# Patient Record
Sex: Female | Born: 1975 | Hispanic: Yes | Marital: Married | Smoking: Light tobacco smoker
Health system: Southern US, Community
[De-identification: ages and names within clinical notes are randomized; demographics above are authoritative.]

## PROBLEM LIST (undated history)

## (undated) DIAGNOSIS — T4145XA Adverse effect of unspecified anesthetic, initial encounter: Secondary | ICD-10-CM

## (undated) DIAGNOSIS — F32A Depression, unspecified: Secondary | ICD-10-CM

## (undated) DIAGNOSIS — G43909 Migraine, unspecified, not intractable, without status migrainosus: Secondary | ICD-10-CM

## (undated) DIAGNOSIS — F419 Anxiety disorder, unspecified: Secondary | ICD-10-CM

## (undated) DIAGNOSIS — J40 Bronchitis, not specified as acute or chronic: Secondary | ICD-10-CM

## (undated) DIAGNOSIS — F329 Major depressive disorder, single episode, unspecified: Secondary | ICD-10-CM

## (undated) DIAGNOSIS — I10 Essential (primary) hypertension: Secondary | ICD-10-CM

## (undated) DIAGNOSIS — T8859XA Other complications of anesthesia, initial encounter: Secondary | ICD-10-CM

---

## 2006-06-27 ENCOUNTER — Emergency Department (HOSPITAL_COMMUNITY): Admission: AD | Admit: 2006-06-27 | Discharge: 2006-06-27 | Payer: Self-pay | Admitting: Emergency Medicine

## 2007-12-06 ENCOUNTER — Emergency Department (HOSPITAL_COMMUNITY): Admission: EM | Admit: 2007-12-06 | Discharge: 2007-12-06 | Payer: Self-pay | Admitting: Emergency Medicine

## 2007-12-10 ENCOUNTER — Emergency Department (HOSPITAL_COMMUNITY): Admission: EM | Admit: 2007-12-10 | Discharge: 2007-12-10 | Payer: Self-pay | Admitting: Family Medicine

## 2007-12-21 ENCOUNTER — Encounter: Admission: RE | Admit: 2007-12-21 | Discharge: 2007-12-21 | Payer: Self-pay | Admitting: Obstetrics & Gynecology

## 2009-01-13 ENCOUNTER — Emergency Department (HOSPITAL_COMMUNITY): Admission: EM | Admit: 2009-01-13 | Discharge: 2009-01-13 | Payer: Self-pay | Admitting: Family Medicine

## 2010-04-13 ENCOUNTER — Emergency Department (HOSPITAL_COMMUNITY): Admission: EM | Admit: 2010-04-13 | Discharge: 2010-04-13 | Payer: Self-pay | Admitting: Emergency Medicine

## 2010-07-20 ENCOUNTER — Encounter
Admission: RE | Admit: 2010-07-20 | Discharge: 2010-07-20 | Payer: Self-pay | Source: Home / Self Care | Attending: Family Medicine | Admitting: Family Medicine

## 2010-10-03 LAB — POCT PREGNANCY, URINE: Preg Test, Ur: NEGATIVE

## 2011-03-24 LAB — POCT PREGNANCY, URINE
Operator id: 239701
Preg Test, Ur: NEGATIVE

## 2012-03-08 ENCOUNTER — Emergency Department (HOSPITAL_COMMUNITY)
Admission: EM | Admit: 2012-03-08 | Discharge: 2012-03-08 | Disposition: A | Discharge status: Home / Self Care | Payer: Self-pay | Attending: Emergency Medicine | Admitting: Emergency Medicine

## 2012-03-08 ENCOUNTER — Encounter (HOSPITAL_COMMUNITY): Payer: Self-pay | Admitting: Emergency Medicine

## 2012-03-08 DIAGNOSIS — T63481A Toxic effect of venom of other arthropod, accidental (unintentional), initial encounter: Secondary | ICD-10-CM

## 2012-03-08 DIAGNOSIS — T6391XA Toxic effect of contact with unspecified venomous animal, accidental (unintentional), initial encounter: Secondary | ICD-10-CM | POA: Insufficient documentation

## 2012-03-08 DIAGNOSIS — F3289 Other specified depressive episodes: Secondary | ICD-10-CM | POA: Insufficient documentation

## 2012-03-08 DIAGNOSIS — F329 Major depressive disorder, single episode, unspecified: Secondary | ICD-10-CM | POA: Insufficient documentation

## 2012-03-08 DIAGNOSIS — T63461A Toxic effect of venom of wasps, accidental (unintentional), initial encounter: Secondary | ICD-10-CM | POA: Insufficient documentation

## 2012-03-08 HISTORY — DX: Major depressive disorder, single episode, unspecified: F32.9

## 2012-03-08 HISTORY — DX: Depression, unspecified: F32.A

## 2012-03-08 MED ORDER — IBUPROFEN 400 MG PO TABS
600.0000 mg | ORAL_TABLET | Freq: Once | ORAL | Status: AC
  Administered 2012-03-08: 600 mg via ORAL
  Filled 2012-03-08: qty 1

## 2012-03-08 MED ORDER — DIPHENHYDRAMINE HCL 50 MG PO TABS: 25.0000 mg | ORAL_TABLET | Freq: Three times a day (TID) | ORAL | Status: DC | PRN

## 2012-03-08 MED ORDER — LIDOCAINE-PRILOCAINE 2.5-2.5 % EX CREA
1.0000 "application " | TOPICAL_CREAM | CUTANEOUS | Status: AC
  Administered 2012-03-08: 1 via TOPICAL
  Filled 2012-03-08: qty 5

## 2012-03-08 NOTE — ED Notes (Signed)
Was stung by yellow jackets itching and burning lungs clear given 50 benadryl iv has 20 in left ac no sob mnow still itching

## 2012-03-08 NOTE — ED Provider Notes (Signed)
History  This chart was scribed for Jones Skene, MD by Bennett Scrape. This patient was seen in room TR02C/TR02C and the patient's care was started at 4:03PM.  CSN: [id number]  Arrival date & time 03/08/12  1359   First MD Initiated Contact with Patient 03/08/12 1603      Chief Complaint  Patient presents with  . Allergic Reaction     The history is provided by the patient. A language interpreter was used Hospital doctor for spanish).    Kathy Berry is a 36 y.o. female who presents to the Emergency Department complaining of an allergic reaction after being stung by yellow jackets on her legs and arms that occurred about 2 hours ago. She reports that she started having itching and burning directly after the stings. She denies involvement of her lips or tongue. She denies taking OTC medications at home to improve symptoms PTA but was given 50 benadryl IV in the ED. She denies having trouble breathing, trouble swallowing, CP, abdominal pain, nausea, emesis and SOB as associated symptoms. She has a h/o depression. Pt denies smoking and alcohol use.  Past Medical History  Diagnosis Date  . Depression     History reviewed. No pertinent past surgical history.  History reviewed. No pertinent family history.  History  Substance Use Topics  . Smoking status: Never Smoker   . Smokeless tobacco: Not on file  . Alcohol Use: No    No OB history provided.  Review of Systems  REVIEW OF SYSTEMS:   1.) CONSTITUTIONAL: No fever, chills or systemic signs of infection. No recent, unexplained weight changes.   2.) HEENT: No facial pain, sinus congestion or rhinorrhea is reported. Patient is denying any acute visual or hearing deficits. No sore throat or difficulty swallowing.  3.) NECK: No swelling or masses are reported.   4.) PULMONARY: No cough sputum production or shortness of breath was reported.   5.) CARDIAC: No palpitations, chest pain or pressure.   6.)  ABDOMINAL: Denies abdominal pain, nausea, vomiting or diarrhea. No Hematochezia or melena.  7.) GENITOURINARY: No burning with urination or frequency. No discharge.  8.) BACK: Denying any flank or CVA tenderness. No specific thoracic or lumbar pain.   9.) EXTREMITIES: Denying any extremity edema pitting or rash.   10.) NEUROLOGIC: Denying any focal or lateralizing neurologic impairments.     11.) SKIN: Positive for multiple insect sings to back, chest and extremities. No rashes, itching   Allergies  Review of patient's allergies indicates no known allergies.  Home Medications   Current Outpatient Rx  Name Route Sig Dispense Refill  . IBUPROFEN 200 MG PO TABS Oral Take 200 mg by mouth every 6 (six) hours as needed. For pain      Triage Vitals: BP 125/86  Pulse 82  Temp 99.1 F (37.3 C) (Oral)  Resp 16  SpO2 95%  Physical Exam   PHYSICAL EXAM: VITAL SIGNS:  . Filed Vitals:   03/08/12 1413  BP: 125/86  Pulse: 82  Temp: 99.1 F (37.3 C)  TempSrc: Oral  Resp: 16  SpO2: 95%   CONSTITUTIONAL: Awake, oriented, appears non-toxic HENT: Atraumatic, normocephalic, oral mucosa pink and moist, airway patent. Nares patent without drainage. External ears normal. EYES: Conjunctiva clear, EOMI, PERRLA NECK: Trachea midline, non-tender, supple CARDIOVASCULAR: Normal heart rate, Normal rhythm, No murmurs, rubs, gallops PULMONARY/CHEST: Clear to auscultation, no rhonchi, wheezes, or rales. Symmetrical breath sounds. CHEST WALL: No lesions. Non-tender. ABDOMINAL: Non-distended, soft, non-tender - no rebound or  guarding.  BS normal. EXTREMITIES: No clubbing, cyanosis, or edema SKIN: Multiple stings with diamond sized surrounding welts lateral to the left scapula, mid upper back around T2-T3, each tricep, each thigh and sternal manubrial junction. Warm, Dry, No erythema, No rash   ED Course  Procedures (including critical care time)  DIAGNOSTIC STUDIES: Oxygen Saturation is 95%  on room air, adequate by my interpretation.    COORDINATION OF CARE: 4:34PM-Discussed discharge plan with pt at bedside and pt agreed to plan.   Labs Reviewed - No data to display No results found.   No diagnosis found.    MDM  Kathy Berry is a 36 y.o. female presenting s/p hymenopteran stings.  Doubt honeybee - no retained stingers.  Pt denies allergy to stings.  No systemic signs of anaphylaxis.  Local venom effects/symptoms persist.  Conservative treatment with benadryl po and EMLA cream for the next day or 2 for pain relief.  I explained the diagnosis and have given explicit precautions to return to the ER including difficulty breathing, hoarse voice or any other new or worsening symptoms. The patient understands and accepts the medical plan as it's been dictated and I have answered their questions. Discharge instructions concerning home care and prescriptions have been given.  The patient is STABLE and is discharged to home in good condition.   I personally performed the services described in this documentation, which was scribed in my presence. The recorded information has been reviewed and considered. Jones Skene, M.D.     Jones Skene, MD 03/12/12 1352

## 2012-03-08 NOTE — ED Notes (Addendum)
Pt reports she was stung by 7 or 8 yellow jackets, called 911, pt reports she became "hoarse" and warm to touch around bee stings. Pt reports symptoms have subsided. Pt speaking complete sentences, airway intact. 20 g IV to LAC. Pt given 2 pain pills from EMS, unsure of the name. Pt denies any allergies to bee stings

## 2012-04-23 ENCOUNTER — Inpatient Hospital Stay (HOSPITAL_COMMUNITY)
Admission: AD | Admit: 2012-04-23 | Discharge: 2012-04-23 | Disposition: A | Discharge status: Home / Self Care | Payer: Self-pay | Source: Ambulatory Visit | Attending: Obstetrics & Gynecology | Admitting: Obstetrics & Gynecology

## 2012-04-23 ENCOUNTER — Encounter (HOSPITAL_COMMUNITY): Payer: Self-pay | Admitting: *Deleted

## 2012-04-23 DIAGNOSIS — Z803 Family history of malignant neoplasm of breast: Secondary | ICD-10-CM | POA: Insufficient documentation

## 2012-04-23 DIAGNOSIS — N644 Mastodynia: Secondary | ICD-10-CM

## 2012-04-23 DIAGNOSIS — S29011A Strain of muscle and tendon of front wall of thorax, initial encounter: Secondary | ICD-10-CM

## 2012-04-23 LAB — POCT PREGNANCY, URINE: Preg Test, Ur: NEGATIVE

## 2012-04-23 MED ORDER — IBUPROFEN 600 MG PO TABS: 600.0000 mg | ORAL_TABLET | Freq: Four times a day (QID) | ORAL | Status: DC | PRN

## 2012-04-23 NOTE — Progress Notes (Signed)
[facility] Department of Clinical Social Work Documentation of Interpretation   I assisted _Dee RN__________________ with interpretation of ____questions__________________ for this patient.

## 2012-04-23 NOTE — Discharge Instructions (Signed)
Dolor msculoesqueltico (Musculoskeletal Pain) El dolor musculoesqueltico se siente en huesos y msculos. El dolor puede ocurrir en cualquier parte del cuerpo. El profesional que lo asiste podr tratarlo sin Geologist, engineering causa del dolor. Lo tratar [employer] de laboratorio (sangre y [place]), las radiografas y otros estudios sean normales. La causa de estos dolores puede ser un virus.  CAUSAS Generalmente no existe una causa definida para este trastorno. Tambin el [employer] puede deberse a la [place]. En la actividad excesiva se incluye el hacer ejercicios fsicos muy intensos cuando no se est en buena forma. El dolor de huesos tambin puede deberse a cambios climticos. Los huesos son sensibles a los cambios en la presin atmosfrica. INSTRUCCIONES PARA EL CUIDADO DOMICILIARIO  Para proteger su privacidad, no se entregarn los [employer] pruebas por telfono. Asegrese de conseguirlos. Consulte el modo en que podr obtenerlos si no se lo han informado. Es su responsabilidad contar con los [employer].  Utilice los medicamentos de venta libre o de prescripcin para Chief Technology Officer, Environmental health practitioner o la [place], segn se lo indique el profesional que lo asiste.Si le han administrado medicamentos, no conduzca, no opere maquinarias ni Diplomatic Services operational officer, y tampoco firme documentos legales durante 24 horas. No beba alcohol. No tome pldoras para dormir ni otros medicamentos que Museum/gallery curator.  Podr seguir con todas las actividades a menos que stas le ocasionen ms [employer]. Cuando el dolor disminuya, es importante que gradualmente reanude toda la rutina habitual. Retome las actividades comenzando lentamente. Aumente gradualmente la intensidad y la duracin de sus actividades o del ejercicio.  Durante los perodos de dolor intenso, el reposo en cama puede ser beneficioso. Recustese o sintese en la posicin que le sea ms cmoda.  Coloque hielo sobre  la zona afectada.  Ponga hielo en Lucile Shutters.  Colquese una toalla entre la piel y la bolsa de hielo.  Aplique el hielo durante 10 a 20 minutos 3  4 veces por da.  Si el dolor empeora, o no desaparece puede ser [employer] repetir las pruebas o Education officer, environmental nuevos exmenes. El profesional que lo asiste podr requerir investigar ms profundamente para Veterinary surgeon causa posible. SOLICITE ATENCIN MDICA DE INMEDIATO SI:  Siente que el dolor empeora y no se alivia con los medicamentos.  Siente dolor en el pecho asociado a falta de aire, sudoracin, nuseas o vmitos.  El dolor se localiza en el abdomen.  Comienza a sentir nuevos sntomas que parecen ser diferentes o que lo preocupan. ASEGRESE DE QUE:   Comprende las instrucciones para el alta mdica.  Controlar su enfermedad.  Solicitar atencin mdica de inmediato segn las indicaciones. Document Released: 03/23/2005 Document Revised: 09/05/2011 [facility] Patient Information 2013 [place], [place].

## 2012-04-23 NOTE — MAU Provider Note (Signed)
Chief Complaint: Breast Pain   None    SUBJECTIVE HPI: Kathy Berry is a 36 y.o. [id number] who presents to maternity admissions reporting pain in her right breast radiating to her right upper back.  The pt is crying in MAU and is worried about breast cancer.  She denies family history of breast cancer, but reports her grandmother died of stomach cancer.  She denies lifting anything heavy or doing any new activities this week.  She denies recent illness/coughing.  She did feel nauseous and vomit x1 earlier today.  She had a mammogram last year via the scholarship program but she is unsure of why she had the mammogram.  She denies vaginal bleeding, vaginal itching/burning, urinary symptoms, h/a, dizziness, n/v, or fever/chills.  Hospital Spanish translator used for all communication.    Past Medical History  Diagnosis Date  . Depression    Past Surgical History  Procedure Date  . Cesarean section    History   Social History  . Marital Status: Single    Spouse Name: N/A    Number of Children: N/A  . Years of Education: N/A   Occupational History  . Not on file.   Social History Main Topics  . Smoking status: Current Some Day Smoker    Types: Cigars  . Smokeless tobacco: Not on file  . Alcohol Use: No  . Drug Use: No  . Sexually Active:    Other Topics Concern  . Not on file   Social History Narrative  . No narrative on file   No current facility-administered medications on file prior to encounter.   Current Outpatient Prescriptions on File Prior to Encounter  Medication Sig Dispense Refill  . diphenhydrAMINE (BENADRYL) 50 MG tablet Take 0.5 tablets (25 mg total) by mouth every 8 (eight) hours as needed for itching or allergies.  30 tablet  0   No Known Allergies  ROS: Pertinent items in HPI  OBJECTIVE Blood pressure 137/86, pulse 91, temperature 98.5 F (36.9 C), temperature source Oral, resp. rate 16, height 5\' 2"  (1.575 m), weight 65.409 kg (144 lb 3.2 oz), last  menstrual period 03/21/2012, SpO2 100.00%. GENERAL: Well-developed, well-nourished female in no acute distress.  HEENT: Normocephalic HEART: normal rate/rhythm RESP: normal effort, lungs clear and equal bilaterally ABDOMEN: Soft, non-tender Negative CVA tenderness EXTREMITIES: Nontender, no edema NEURO: Alert and oriented SPECULUM EXAM: Deferred BREAST: Breasts appear normal, no suspicious masses, no skin or nipple changes or axillary nodes, significant tenderness to palpation at right sternal border, under right breast, and tenderness in right upper back to palpation  LAB RESULTS Results for orders placed during the hospital encounter of 04/23/12 (from the past 24 hour(s))  POCT PREGNANCY, URINE     Status: Normal   Collection Time   04/23/12  1:34 PM      Component Value Range   Preg Test, Ur NEGATIVE  NEGATIVE    IMAGING No results found.  ASSESSMENT 1. Breast pain, right   2. Muscle strain of chest wall     PLAN Discharge home Reassured pt of likely musculoskeletal pain because of tenderness to touch underneath breast and in her back.  Discussed low risks of breast cancer at age 56. Recommend she fill out scholarship information again for repeat breast imaging if problem persists Ibuprofen 600 mg Q6 hours x3-4 days Return to MAU if symptoms persist/worsen    Sharen Counter Certified Nurse-Midwife 04/23/2012  2:58 PM

## 2012-04-23 NOTE — MAU Provider Note (Signed)
Medical Screening exam and patient care preformed by advanced practice provider.  Agree with the above management.  

## 2012-04-23 NOTE — Progress Notes (Signed)
[facility] Department of Clinical Social Work Documentation of Interpretation   I assisted __Donna RN_________________ with interpretation of ____questions__________________ for this patient. 

## 2012-04-23 NOTE — MAU Note (Signed)
Patient states she went to [facility] today because she has been having pain in the right breast for 2 days. Was sent to MAU for evaluation.

## 2012-04-30 ENCOUNTER — Encounter (HOSPITAL_COMMUNITY): Payer: Self-pay

## 2012-04-30 ENCOUNTER — Ambulatory Visit (HOSPITAL_COMMUNITY)
Admission: RE | Admit: 2012-04-30 | Discharge: 2012-04-30 | Disposition: A | Discharge status: Home / Self Care | Payer: Self-pay | Source: Ambulatory Visit | Attending: Obstetrics and Gynecology | Admitting: Obstetrics and Gynecology

## 2012-04-30 ENCOUNTER — Other Ambulatory Visit: Payer: Self-pay | Admitting: Obstetrics and Gynecology

## 2012-04-30 VITALS — BP 106/70 | Temp 97.8°F | Ht 62.0 in | Wt 144.0 lb

## 2012-04-30 DIAGNOSIS — Z1239 Encounter for other screening for malignant neoplasm of breast: Secondary | ICD-10-CM

## 2012-04-30 DIAGNOSIS — N644 Mastodynia: Secondary | ICD-10-CM | POA: Insufficient documentation

## 2012-04-30 HISTORY — DX: Anxiety disorder, unspecified: F41.9

## 2012-04-30 HISTORY — DX: Essential (primary) hypertension: I10

## 2012-04-30 NOTE — Patient Instructions (Signed)
Taught patient how to perform BSE and gave educational materials to take home. Patient did not need a Pap smear today due to last Pap smear was in 2011 per patient. Let her know [facility] will cover Pap smears every 3 years unless has a history of abnormal Pap smears. Let her know her next Pap smear will be due next year. Patient referred to the [facility] for Diagnostic Mammogram and possible right breast ultrasound. Appointment scheduled for Friday, May 04, 2012 at 1330.    Patient aware of appointment and will be there. Patient verbalized understanding.

## 2012-04-30 NOTE — Progress Notes (Signed)
Complaints of right breast x 1 week that she rates at a 7 out of 10. Patient stated the pain in her breast has been happening on and off for past 3 years. Patient's last screening mammogram was 07/20/10 at the [facility] [place].  Pap Smear:    Pap smear not performed today. Per patient last Pap smear was in 2011 and normal. Per patient she has no history of abnormal Pap smears. No Pap smear results in EPIC.   Physical exam: Breasts Breasts symmetrical. No skin abnormalities bilateral breasts. No nipple retraction bilateral breasts. No nipple discharge bilateral breasts. No lymphadenopathy. No lumps palpated bilateral breasts. Patient complained of tenderness when palpated right breast. Tenderness was complained more towards the center of the breast. Patient referred to the [facility] for Diagnostic Mammogram and possible right breast ultrasound. Appointment scheduled for Friday, May 04, 2012 at 1330.        Pelvic/Bimanual No Pap smear completed today since per patient last Pap smear was in 2011 and normal. Pap smear not indicated per [facility] guidelines.

## 2012-04-30 NOTE — Assessment & Plan Note (Signed)
Patient referred to the [facility] for Diagnostic Mammogram and possible right breast ultrasound. Appointment scheduled for Friday, May 04, 2012 at 1330.

## 2012-05-04 ENCOUNTER — Ambulatory Visit
Admission: RE | Admit: 2012-05-04 | Discharge: 2012-05-04 | Disposition: A | Discharge status: Home / Self Care | Payer: No Typology Code available for payment source | Source: Ambulatory Visit | Attending: Obstetrics and Gynecology | Admitting: Obstetrics and Gynecology

## 2012-05-04 DIAGNOSIS — N644 Mastodynia: Secondary | ICD-10-CM

## 2012-06-28 NOTE — Addendum Note (Signed)
Encounter addended by: Saintclair Halsted, RN on: 06/28/2012  4:04 PM<BR>     Documentation filed: Charges VN

## 2013-02-06 ENCOUNTER — Emergency Department (HOSPITAL_COMMUNITY)
Admission: EM | Admit: 2013-02-06 | Discharge: 2013-02-06 | Disposition: A | Discharge status: Home / Self Care | Payer: Self-pay | Attending: Emergency Medicine | Admitting: Emergency Medicine

## 2013-02-06 ENCOUNTER — Encounter (HOSPITAL_COMMUNITY): Payer: Self-pay | Admitting: Nurse Practitioner

## 2013-02-06 ENCOUNTER — Emergency Department (HOSPITAL_COMMUNITY): Payer: Self-pay

## 2013-02-06 DIAGNOSIS — F17200 Nicotine dependence, unspecified, uncomplicated: Secondary | ICD-10-CM | POA: Insufficient documentation

## 2013-02-06 DIAGNOSIS — M79609 Pain in unspecified limb: Secondary | ICD-10-CM | POA: Insufficient documentation

## 2013-02-06 DIAGNOSIS — Z8709 Personal history of other diseases of the respiratory system: Secondary | ICD-10-CM | POA: Insufficient documentation

## 2013-02-06 DIAGNOSIS — Z8659 Personal history of other mental and behavioral disorders: Secondary | ICD-10-CM | POA: Insufficient documentation

## 2013-02-06 DIAGNOSIS — R079 Chest pain, unspecified: Secondary | ICD-10-CM

## 2013-02-06 DIAGNOSIS — I10 Essential (primary) hypertension: Secondary | ICD-10-CM | POA: Insufficient documentation

## 2013-02-06 DIAGNOSIS — R0789 Other chest pain: Secondary | ICD-10-CM | POA: Insufficient documentation

## 2013-02-06 HISTORY — DX: Bronchitis, not specified as acute or chronic: J40

## 2013-02-06 LAB — POCT I-STAT TROPONIN I: Troponin i, poc: 0 ng/mL (ref 0.00–0.08)

## 2013-02-06 LAB — POCT I-STAT, CHEM 8
Hemoglobin: 14.6 g/dL (ref 12.0–15.0)
Sodium: 141 mEq/L (ref 135–145)
TCO2: 22 mmol/L (ref 0–100)

## 2013-02-06 NOTE — ED Notes (Signed)
Per EMS pt c/o chest wall pain started in left shoulder and radiated to right side of chest. Tender to palpation, pain with deep palpation. 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 Denies n/v. sts pain takes her breathe. Pt sts minimal relief with ibuprofen. EMS GAVE 324 ASA. Pain 5/10- pressure. Pt[facility]. Pt sts not pregnant.

## 2013-02-06 NOTE — ED Provider Notes (Signed)
CSN: [id number]     Arrival date & time 02/06/13  1215 History     First MD Initiated Contact with Patient 02/06/13 1231     Chief Complaint  Patient presents with  . Chest Pain   (Consider location/radiation/quality/duration/timing/severity/associated sxs/prior Treatment) HPI Comments: Kathy Berry is a 37 y.o. Female who presents for evaluation of left arm and left chest pain. The pain started one week ago. It is gradually worsening. It is dull in nature. The pain comes and goes. The pain is not associated with movement of the neck or left. The pain is worse with deep breathing. 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 She is tearful, and states that she misses her children. Her children are in another country. She has not seen them in 11 years. She is not currently taking any medications regularly. She tried taking ibuprofen at home, without relief. She is transported here by EMS. During transport she received aspirin orally. There are no other known modifying factors.   Patient is a 37 y.o. female presenting with chest pain. The history is provided by the patient. A language interpreter was used Associate Professor).  Chest Pain   Past Medical History  Diagnosis Date  . Depression   . Hypertension   . Anxiety   . Bronchitis    Past Surgical History  Procedure Laterality Date  . Cesarean section     Family History  Problem Relation Age of Onset  . Other Neg Hx   . Hypertension Mother   . Cancer Maternal Grandmother     stomach  . Diabetes Maternal Grandfather    History  Substance Use Topics  . Smoking status: Current Every Day Smoker    Types: Cigars, Cigarettes  . Smokeless tobacco: Never Used  . Alcohol Use: No   OB History   Grav Para Term Preterm Abortions TAB SAB Ect Mult Living   2 2 2       2      Review of Systems  Cardiovascular: Positive for chest pain.  All other systems reviewed and are negative.    Allergies  Review of patient's  allergies indicates no known allergies.  Home Medications   Current Outpatient Rx  Name  Route  Sig  Dispense  Refill  . ibuprofen (ADVIL,MOTRIN) 600 MG tablet   Oral   Take 1 tablet (600 mg total) by mouth every 6 (six) hours as needed for pain.   30 tablet   0    BP 119/75  Pulse 93  Temp(Src) 98.1 F (36.7 C) (Oral)  Resp 18  Ht 5\' 2"  (1.575 m)  Wt 140 lb (63.504 kg)  BMI 25.6 kg/m2  SpO2 100% Physical Exam  Nursing note and vitals reviewed. Constitutional: She is oriented to person, place, and time. She appears well-developed and well-nourished.  HENT:  Head: Normocephalic and atraumatic.  Eyes: Conjunctivae and EOM are normal. Pupils are equal, round, and reactive to light.  Neck: Normal range of motion and phonation normal. Neck supple.  Cardiovascular: Normal rate, regular rhythm and intact distal pulses.   Pulmonary/Chest: Effort normal and breath sounds normal. She exhibits tenderness (mild left upper chest wall tenderness. No associated crepitation or deformity).  Abdominal: Soft. She exhibits no distension. There is no tenderness. There is no guarding.  Musculoskeletal: Normal range of motion.  Neurological: She is alert and oriented to person, place, and time. She has normal strength. She exhibits normal muscle tone.  Skin: Skin is warm and  dry.  Psychiatric: Her behavior is normal. Judgment and thought content normal.  Tearful when talking about missing her children    ED Course   Procedures (including critical care time)  On arrival to the ED, she PERC negative      Date: 02/06/13  Rate: 87  Rhythm: normal sinus rhythm  QRS Axis: normal  PR and QT Intervals: normal  ST/T Wave abnormalities: normal  PR and QRS Conduction Disutrbances:none  Narrative Interpretation:   Old EKG Reviewed: none available    Labs Reviewed  POCT I-STAT, CHEM 8  POCT I-STAT TROPONIN I    1. Nonspecific chest pain     MDM  Nonspecific CP. Doubt ACS, PE, PNE.  Doubt metabolic instability, serious bacterial infection or impending vascular collapse; the patient is stable for discharge.   Nursing Notes Reviewed/ Care Coordinated Applicable Imaging Reviewed Interpretation of Laboratory Data incorporated into ED treatment  Plan: Home Medications- none; Home Treatments- Rest, watch for progressive sx; return here if the recommended treatment, does not improve the symptoms; Recommended follow up- PCP prn   Flint Melter, MD 02/08/13 1714

## 2013-04-21 ENCOUNTER — Emergency Department (HOSPITAL_COMMUNITY): Payer: No Typology Code available for payment source

## 2013-04-21 ENCOUNTER — Encounter (HOSPITAL_COMMUNITY): Payer: Self-pay | Admitting: Emergency Medicine

## 2013-04-21 ENCOUNTER — Emergency Department (HOSPITAL_COMMUNITY)
Admission: EM | Admit: 2013-04-21 | Discharge: 2013-04-22 | Disposition: A | Discharge status: Home / Self Care | Payer: No Typology Code available for payment source | Attending: Emergency Medicine | Admitting: Emergency Medicine

## 2013-04-21 DIAGNOSIS — R112 Nausea with vomiting, unspecified: Secondary | ICD-10-CM | POA: Insufficient documentation

## 2013-04-21 DIAGNOSIS — G8929 Other chronic pain: Secondary | ICD-10-CM | POA: Insufficient documentation

## 2013-04-21 DIAGNOSIS — Z87891 Personal history of nicotine dependence: Secondary | ICD-10-CM | POA: Insufficient documentation

## 2013-04-21 DIAGNOSIS — Z3202 Encounter for pregnancy test, result negative: Secondary | ICD-10-CM | POA: Insufficient documentation

## 2013-04-21 DIAGNOSIS — I10 Essential (primary) hypertension: Secondary | ICD-10-CM | POA: Insufficient documentation

## 2013-04-21 DIAGNOSIS — Z8659 Personal history of other mental and behavioral disorders: Secondary | ICD-10-CM | POA: Insufficient documentation

## 2013-04-21 DIAGNOSIS — R071 Chest pain on breathing: Secondary | ICD-10-CM | POA: Insufficient documentation

## 2013-04-21 DIAGNOSIS — Z8709 Personal history of other diseases of the respiratory system: Secondary | ICD-10-CM | POA: Insufficient documentation

## 2013-04-21 DIAGNOSIS — R0602 Shortness of breath: Secondary | ICD-10-CM | POA: Insufficient documentation

## 2013-04-21 DIAGNOSIS — R0789 Other chest pain: Secondary | ICD-10-CM

## 2013-04-21 HISTORY — DX: Migraine, unspecified, not intractable, without status migrainosus: G43.909

## 2013-04-21 LAB — CBC
HCT: 40.3 % (ref 36.0–46.0)
Hemoglobin: 13.7 g/dL (ref 12.0–15.0)
MCH: 30.7 pg (ref 26.0–34.0)
MCHC: 34 g/dL (ref 30.0–36.0)
MCV: 90.4 fL (ref 78.0–100.0)

## 2013-04-21 LAB — POCT I-STAT TROPONIN I: Troponin i, poc: 0.01 ng/mL (ref 0.00–0.08)

## 2013-04-21 NOTE — ED Notes (Signed)
C/o pressure in center of chest with sob, nausea, and vomited x 4.  States symptoms started 1 hour ago while watching TV.

## 2013-04-22 ENCOUNTER — Encounter (HOSPITAL_COMMUNITY): Payer: Self-pay | Admitting: Emergency Medicine

## 2013-04-22 LAB — URINALYSIS, ROUTINE W REFLEX MICROSCOPIC
Glucose, UA: NEGATIVE mg/dL
Hgb urine dipstick: NEGATIVE
Specific Gravity, Urine: 1.011 (ref 1.005–1.030)
Urobilinogen, UA: 0.2 mg/dL (ref 0.0–1.0)

## 2013-04-22 LAB — HEPATIC FUNCTION PANEL
ALT: 12 U/L (ref 0–35)
AST: 17 U/L (ref 0–37)
Albumin: 4 g/dL (ref 3.5–5.2)
Total Protein: 7.6 g/dL (ref 6.0–8.3)

## 2013-04-22 LAB — BASIC METABOLIC PANEL
BUN: 17 mg/dL (ref 6–23)
Calcium: 9.3 mg/dL (ref 8.4–10.5)
Chloride: 103 mEq/L (ref 96–112)
GFR calc non Af Amer: >90 mL/min (ref >90)
Glucose, Bld: 141 mg/dL — ABNORMAL HIGH (ref 70–99)

## 2013-04-22 LAB — LIPASE, BLOOD: Lipase: 27 U/L (ref 11–59)

## 2013-04-22 MED ORDER — CYCLOBENZAPRINE HCL 10 MG PO TABS: 10.0000 mg | ORAL_TABLET | Freq: Three times a day (TID) | ORAL | Status: DC | PRN

## 2013-04-22 MED ORDER — NAPROXEN 250 MG PO TABS: 250.0000 mg | ORAL_TABLET | Freq: Two times a day (BID) | ORAL | Status: DC

## 2013-04-22 MED ORDER — IBUPROFEN 200 MG PO TABS
400.0000 mg | ORAL_TABLET | Freq: Once | ORAL | Status: AC
  Administered 2013-04-22: 400 mg via ORAL
  Filled 2013-04-22: qty 2

## 2013-04-22 MED ORDER — OXYCODONE-ACETAMINOPHEN 5-325 MG PO TABS
1.0000 | ORAL_TABLET | Freq: Once | ORAL | Status: AC
  Administered 2013-04-22: 1 via ORAL
  Filled 2013-04-22: qty 1

## 2013-04-22 MED ORDER — HYDROCODONE-ACETAMINOPHEN 5-325 MG PO TABS: ORAL_TABLET | ORAL | Status: DC

## 2013-04-22 NOTE — ED Provider Notes (Signed)
CSN: [id number]     Arrival date & time 04/21/13  2233 History   First MD Initiated Contact with Patient 04/21/13 2351     Chief Complaint  Patient presents with  . Shortness of Breath  . Chest Pain    HPI Pt was seen at 0005. Per pt and her husband, c/o gradual onset and persistence of constant acute flair of her chronic left sided chest pain for the past several years, worse over the past 2 months. Pt states she has the pain "every day, all day" for the past 2 months. Has been evaluated by the ED and her PMD for same, and told it "was stress" and "a muscle spasm." Pt describes the chest pain as "sharp" "pressure" in the left lateral side of her chest and left thoracic back area.. Pain worsens with palpation of the area. Pt states the pain makes her SOB and nauseated. States she vomited PTA. Denies diarrhea, no cough, no injury, no abd pain, no black or blood in stools or emesis, no rash, no fevers.     Past Medical History  Diagnosis Date  . Depression   . Hypertension   . Anxiety   . Bronchitis   . Migraine headache    Past Surgical History  Procedure Laterality Date  . Cesarean section     Family History  Problem Relation Age of Onset  . Other Neg Hx   . Hypertension Mother   . Cancer Maternal Grandmother     stomach  . Diabetes Maternal Grandfather    History  Substance Use Topics  . Smoking status: Former Smoker    Types: Cigarettes, Cigars    Quit date: 04/21/2012  . Smokeless tobacco: Never Used  . Alcohol Use: No   OB History   Grav Para Term Preterm Abortions TAB SAB Ect Mult Living   2 2 2       2      Review of Systems ROS: Statement: All systems negative except as marked or noted in the HPI; Constitutional: Negative for fever and chills. ; ; Eyes: Negative for eye pain, redness and discharge. ; ; ENMT: Negative for ear pain, hoarseness, nasal congestion, sinus pressure and sore throat. ; ; Cardiovascular: +CP, SOB. Negative for palpitations, diaphoresis, and  peripheral edema. ; ; Respiratory: Negative for cough, wheezing and stridor. ; ; Gastrointestinal: +N/V. Negative for diarrhea, abdominal pain, blood in stool, hematemesis, jaundice and rectal bleeding. . ; ; Genitourinary: Negative for dysuria, flank pain and hematuria. ; ; Musculoskeletal: Negative for back pain and neck pain. Negative for swelling and trauma.; ; Skin: Negative for pruritus, rash, abrasions, blisters, bruising and skin lesion.; ; Neuro: Negative for headache, lightheadedness and neck stiffness. Negative for weakness, altered level of consciousness , altered mental status, extremity weakness, paresthesias, involuntary movement, seizure and syncope.       Allergies  Review of patient's allergies indicates no known allergies.  Home Medications   Current Outpatient Rx  Name  Route  Sig  Dispense  Refill  . ibuprofen (ADVIL,MOTRIN) 600 MG tablet   Oral   Take 1 tablet (600 mg total) by mouth every 6 (six) hours as needed for pain.   30 tablet   0    BP 141/82  Pulse 109  Temp(Src) 98.3 F (36.8 C) (Oral)  Resp 19  SpO2 100%  LMP 04/02/2013 Physical Exam 0010: Physical examination:  Nursing notes reviewed; Vital signs and O2 SAT reviewed;  Constitutional: Well developed, Well nourished, Well hydrated,  In no acute distress; Head:  Normocephalic, atraumatic; Eyes: EOMI, PERRL, No scleral icterus; ENMT: Mouth and pharynx normal, Mucous membranes moist; Neck: Supple, Full range of motion, No lymphadenopathy; Cardiovascular: Tachycardic and rhythm, No murmur, rub, or gallop; Respiratory: Breath sounds clear & equal bilaterally, No rales, rhonchi, wheezes.  Speaking full sentences with ease, Normal respiratory effort/excursion; Chest: +left lateral chest wall tender to palp. No rash, no soft tissue crepitus, no deformity. Movement normal; Abdomen: Soft, Nontender, Nondistended, Normal bowel sounds; Genitourinary: No CVA tenderness; Extremities: Pulses normal, No tenderness, No  edema, No calf edema or asymmetry.; Neuro: AA&Ox3, Major CN grossly intact.  Speech clear. No gross focal motor or sensory deficits in extremities.; Skin: Color normal, Warm, Dry.   ED Course  Procedures    EKG Interpretation     Ventricular Rate:  122 PR Interval:  174 QRS Duration: 90 QT Interval:  306 QTC Calculation: 436 R Axis:   50 Text Interpretation:  Sinus tachycardia RSR' or QR pattern in V1 suggests right ventricular conduction delay No significant change since last tracing            MDM  MDM Reviewed: previous chart, nursing note and vitals Reviewed previous: labs and ECG Interpretation: labs, ECG and x-ray   Results for orders placed during the hospital encounter of 04/21/13  CBC      Result Value Range   WBC 8.8  4.0 - 10.5 K/uL   RBC 4.46  3.87 - 5.11 MIL/uL   Hemoglobin 13.7  12.0 - 15.0 g/dL   HCT 16.1  09.6 - 04.5 %   MCV 90.4  78.0 - 100.0 fL   MCH 30.7  26.0 - 34.0 pg   MCHC 34.0  30.0 - 36.0 g/dL   RDW 40.9  81.1 - 91.4 %   Platelets 212  150 - 400 K/uL  BASIC METABOLIC PANEL      Result Value Range   Sodium 138  135 - 145 mEq/L   Potassium 3.7  3.5 - 5.1 mEq/L   Chloride 103  96 - 112 mEq/L   CO2 22  19 - 32 mEq/L   Glucose, Bld 141 (*) 70 - 99 mg/dL   BUN 17  6 - 23 mg/dL   Creatinine, Ser 7.82  0.50 - 1.10 mg/dL   Calcium 9.3  8.4 - 95.6 mg/dL   GFR calc non Af Amer >90  >90 mL/min   GFR calc Af Amer >90  >90 mL/min  D-DIMER, QUANTITATIVE      Result Value Range   D-Dimer, Quant <0.27  0.00 - 0.48 ug/mL-FEU  PREGNANCY, URINE      Result Value Range   Preg Test, Ur NEGATIVE  NEGATIVE  URINALYSIS, ROUTINE W REFLEX MICROSCOPIC      Result Value Range   Color, Urine YELLOW  YELLOW   APPearance CLEAR  CLEAR   Specific Gravity, Urine 1.011  1.005 - 1.030   pH 6.0  5.0 - 8.0   Glucose, UA NEGATIVE  NEGATIVE mg/dL   Hgb urine dipstick NEGATIVE  NEGATIVE   Bilirubin Urine NEGATIVE  NEGATIVE   Ketones, ur NEGATIVE  NEGATIVE mg/dL    Protein, ur NEGATIVE  NEGATIVE mg/dL   Urobilinogen, UA 0.2  0.0 - 1.0 mg/dL   Nitrite NEGATIVE  NEGATIVE   Leukocytes, UA NEGATIVE  NEGATIVE  LIPASE, BLOOD      Result Value Range   Lipase 27  11 - 59 U/L  HEPATIC FUNCTION PANEL  Result Value Range   Total Protein 7.6  6.0 - 8.3 g/dL   Albumin 4.0  3.5 - 5.2 g/dL   AST 17  0 - 37 U/L   ALT 12  0 - 35 U/L   Alkaline Phosphatase 60  39 - 117 U/L   Total Bilirubin 0.2 (*) 0.3 - 1.2 mg/dL   Bilirubin, Direct <4.0  0.0 - 0.3 mg/dL   Indirect Bilirubin NOT CALCULATED  0.3 - 0.9 mg/dL  POCT I-STAT TROPONIN I      Result Value Range   Troponin i, poc 0.01  0.00 - 0.08 ng/mL   Comment 3            Dg Chest 2 View 04/21/2013   CLINICAL DATA:  Shortness of breath and chest  EXAM: CHEST  2 VIEW  COMPARISON:  02/06/2013  FINDINGS: The heart size and mediastinal contours are within normal limits. Both lungs are clear. The visualized skeletal structures are unremarkable.  IMPRESSION: No active cardiopulmonary disease.   Electronically Signed   By: Tiburcio Pea M.D.   On: 04/21/2013 23:11    0345: Doubt PE as cause for symptoms with normal d-dimer and low risk [place].  Doubt ACS as cause for symptoms with normal troponin and unchanged EKG from previous after 2 months of constant symptoms. Will tx symptomatically at this time. Pt wants to go home now. Dx and testing d/w pt and family.  Questions answered.  Verb understanding, agreeable to d/c home with outpt f/u.        Laray Anger, DO 04/25/13 2001

## 2013-04-22 NOTE — ED Notes (Signed)
Patient stating that she is "short of breath." O2 remained at 100% on RA. HR 102. Lungs clear in all fields. Dr. Clarene Duke made aware.

## 2013-07-03 ENCOUNTER — Emergency Department (HOSPITAL_COMMUNITY): Payer: Self-pay

## 2013-07-03 ENCOUNTER — Emergency Department (HOSPITAL_COMMUNITY)
Admission: EM | Admit: 2013-07-03 | Discharge: 2013-07-03 | Disposition: A | Discharge status: Home / Self Care | Payer: Self-pay | Attending: Emergency Medicine | Admitting: Emergency Medicine

## 2013-07-03 ENCOUNTER — Encounter (HOSPITAL_COMMUNITY): Payer: Self-pay | Admitting: Emergency Medicine

## 2013-07-03 ENCOUNTER — Other Ambulatory Visit: Payer: Self-pay

## 2013-07-03 DIAGNOSIS — IMO0002 Reserved for concepts with insufficient information to code with codable children: Secondary | ICD-10-CM | POA: Insufficient documentation

## 2013-07-03 DIAGNOSIS — R110 Nausea: Secondary | ICD-10-CM | POA: Insufficient documentation

## 2013-07-03 DIAGNOSIS — Z87891 Personal history of nicotine dependence: Secondary | ICD-10-CM | POA: Insufficient documentation

## 2013-07-03 DIAGNOSIS — Z8659 Personal history of other mental and behavioral disorders: Secondary | ICD-10-CM | POA: Insufficient documentation

## 2013-07-03 DIAGNOSIS — Z3202 Encounter for pregnancy test, result negative: Secondary | ICD-10-CM | POA: Insufficient documentation

## 2013-07-03 DIAGNOSIS — M940 Chondrocostal junction syndrome [Tietze]: Secondary | ICD-10-CM | POA: Insufficient documentation

## 2013-07-03 DIAGNOSIS — G43909 Migraine, unspecified, not intractable, without status migrainosus: Secondary | ICD-10-CM | POA: Insufficient documentation

## 2013-07-03 DIAGNOSIS — Z8709 Personal history of other diseases of the respiratory system: Secondary | ICD-10-CM | POA: Insufficient documentation

## 2013-07-03 DIAGNOSIS — I10 Essential (primary) hypertension: Secondary | ICD-10-CM | POA: Insufficient documentation

## 2013-07-03 DIAGNOSIS — M62838 Other muscle spasm: Secondary | ICD-10-CM

## 2013-07-03 DIAGNOSIS — Z79899 Other long term (current) drug therapy: Secondary | ICD-10-CM | POA: Insufficient documentation

## 2013-07-03 DIAGNOSIS — M5380 Other specified dorsopathies, site unspecified: Secondary | ICD-10-CM | POA: Insufficient documentation

## 2013-07-03 LAB — BASIC METABOLIC PANEL
BUN: 13 mg/dL (ref 6–23)
CHLORIDE: 106 meq/L (ref 96–112)
CO2: 27 mEq/L (ref 19–32)
CREATININE: 0.65 mg/dL (ref 0.50–1.10)
Calcium: 9.4 mg/dL (ref 8.4–10.5)
GFR calc non Af Amer: >90 mL/min (ref >90)
Glucose, Bld: 100 mg/dL — ABNORMAL HIGH (ref 70–99)
Potassium: 4.4 mEq/L (ref 3.7–5.3)
SODIUM: 144 meq/L (ref 137–147)

## 2013-07-03 LAB — CBC
HCT: 41.8 % (ref 36.0–46.0)
Hemoglobin: 14 g/dL (ref 12.0–15.0)
MCH: 31 pg (ref 26.0–34.0)
MCHC: 33.5 g/dL (ref 30.0–36.0)
MCV: 92.7 fL (ref 78.0–100.0)
PLATELETS: 225 10*3/uL (ref 150–400)
RBC: 4.51 MIL/uL (ref 3.87–5.11)
RDW: 13.1 % (ref 11.5–15.5)
WBC: 5.4 10*3/uL (ref 4.0–10.5)

## 2013-07-03 LAB — PRO B NATRIURETIC PEPTIDE: PRO B NATRI PEPTIDE: 25.3 pg/mL (ref 0–125)

## 2013-07-03 LAB — POCT I-STAT TROPONIN I: Troponin i, poc: 0.01 ng/mL (ref 0.00–0.08)

## 2013-07-03 LAB — POCT PREGNANCY, URINE: Preg Test, Ur: NEGATIVE

## 2013-07-03 MED ORDER — RANITIDINE HCL 300 MG PO TABS: 300.0000 mg | ORAL_TABLET | Freq: Every day | ORAL | Status: DC

## 2013-07-03 MED ORDER — PREDNISONE 50 MG PO TABS
50.0000 mg | ORAL_TABLET | Freq: Every day | ORAL | Status: DC
Start: 2013-07-03 — End: 2013-07-03

## 2013-07-03 MED ORDER — METHOCARBAMOL 500 MG PO TABS: 500.0000 mg | ORAL_TABLET | Freq: Two times a day (BID) | ORAL | Status: DC

## 2013-07-03 MED ORDER — RANITIDINE HCL 150 MG PO CAPS: 150.0000 mg | ORAL_CAPSULE | Freq: Every day | ORAL | Status: DC

## 2013-07-03 MED ORDER — PREDNISONE 50 MG PO TABS: 50.0000 mg | ORAL_TABLET | Freq: Every day | ORAL | Status: DC

## 2013-07-03 MED ORDER — IBUPROFEN 400 MG PO TABS: 400.0000 mg | ORAL_TABLET | Freq: Four times a day (QID) | ORAL | Status: DC | PRN

## 2013-07-03 MED ORDER — IBUPROFEN 800 MG PO TABS: 800.0000 mg | ORAL_TABLET | Freq: Three times a day (TID) | ORAL | Status: DC | PRN

## 2013-07-03 NOTE — ED Notes (Signed)
MD at bedside. 

## 2013-07-03 NOTE — ED Notes (Signed)
The pt returned from xray 

## 2013-07-03 NOTE — ED Notes (Signed)
To x-ray

## 2013-07-03 NOTE — ED Provider Notes (Addendum)
CSN: [id number]     Arrival date & time 07/03/13  9147 History   First MD Initiated Contact with Patient 07/03/13 1057     Chief Complaint  Patient presents with  . Shortness of Breath  . Back Pain   (Consider location/radiation/quality/duration/timing/severity/associated sxs/prior Treatment) HPI Comments: Pt comes in with cc of chest pain, nausea, back pain, dib. Sx started at 1 am - but chest pain has been ongoing for months now. Pain is located in the scapular region, between her blades. There is no anterior chest pain. Pt was having some dib with laying flat, and also her pain is worse when laying flat. She has no n/v/f/c - no cough. No hx of PE, DVT, and no risk factors for the same. No night sweats. Pt had a dimer ordered for her chest pain just her last visit - few months back, and that was normal as well. Pt states that she has some neck pain on the left side as well. Her pain is worse with palpation and with movement of the arm. Taking naproxen and heat pads at home.  Patient is a 38 y.o. female presenting with shortness of breath and back pain. The history is provided by the patient.  Shortness of Breath Associated symptoms: neck pain   Associated symptoms: no abdominal pain, no chest pain, no headaches and no vomiting   Back Pain Associated symptoms: no abdominal pain, no chest pain, no dysuria and no headaches     Past Medical History  Diagnosis Date  . Depression   . Hypertension   . Anxiety   . Bronchitis   . Migraine headache    Past Surgical History  Procedure Laterality Date  . Cesarean section     Family History  Problem Relation Age of Onset  . Other Neg Hx   . Hypertension Mother   . Cancer Maternal Grandmother     stomach  . Diabetes Maternal Grandfather    History  Substance Use Topics  . Smoking status: Former Smoker    Types: Cigarettes, Cigars    Quit date: 04/21/2012  . Smokeless tobacco: Never Used  . Alcohol Use: No   OB History   Grav Para  Term Preterm Abortions TAB SAB Ect Mult Living   2 2 2       2      Review of Systems  Constitutional: Negative for activity change.  Respiratory: Negative for shortness of breath.   Cardiovascular: Negative for chest pain.  Gastrointestinal: Negative for nausea, vomiting and abdominal pain.  Genitourinary: Negative for dysuria.  Musculoskeletal: Positive for back pain and neck pain.  Neurological: Negative for headaches.    Allergies  Review of patient's allergies indicates no known allergies.  Home Medications   Current Outpatient Rx  Name  Route  Sig  Dispense  Refill  . ibuprofen (ADVIL,MOTRIN) 800 MG tablet   Oral   Take 1 tablet (800 mg total) by mouth every 8 (eight) hours as needed.   21 tablet   0   . methocarbamol (ROBAXIN) 500 MG tablet   Oral   Take 1 tablet (500 mg total) by mouth 2 (two) times daily.   20 tablet   0   . predniSONE (DELTASONE) 50 MG tablet   Oral   Take 1 tablet (50 mg total) by mouth daily.   5 tablet   0   . ranitidine (ZANTAC) 150 MG capsule   Oral   Take 1 capsule (150 mg total) by mouth  daily.   30 capsule   0    BP 121/72  Pulse 91  Temp(Src) 99.4 F (37.4 C) (Oral)  Resp 18  SpO2 100%  LMP 06/27/2013 Physical Exam  Nursing note and vitals reviewed. Constitutional: She is oriented to person, place, and time. She appears well-developed and well-nourished.  HENT:  Head: Normocephalic and atraumatic.  Eyes: EOM are normal. Pupils are equal, round, and reactive to light.  Neck: Neck supple.  Cardiovascular: Normal rate, regular rhythm, normal heart sounds and intact distal pulses.   No murmur heard. Pulmonary/Chest: Effort normal. No respiratory distress.  Abdominal: Soft. She exhibits no distension. There is no tenderness. There is no rebound and no guarding.  Musculoskeletal:  Reproducible pain  - in the scapular, region and intrascapular region with palpation.  Neurological: She is alert and oriented to person, place,  and time.  Skin: Skin is warm and dry.    ED Course  Procedures (including critical care time) Labs Review Labs Reviewed  BASIC METABOLIC PANEL - Abnormal; Notable for the following:    Glucose, Bld 100 (*)    All other components within normal limits  CBC  PRO B NATRIURETIC PEPTIDE  POCT PREGNANCY, URINE  POCT I-STAT TROPONIN I   Imaging Review Dg Chest 2 View  07/03/2013   CLINICAL DATA:  Shortness of breath.  Back pain.  EXAM: CHEST  2 VIEW  COMPARISON:  PA and lateral chest 04/21/2013.  FINDINGS: Heart size and mediastinal contours are within normal limits. Both lungs are clear. Visualized skeletal structures are unremarkable.  IMPRESSION: Negative exam.   Electronically Signed   By: Drusilla Kannerhomas  Dalessio M.D.   On: 07/03/2013 09:44   Dg Lumbar Spine Complete  07/03/2013   CLINICAL DATA:  Back pain  EXAM: LUMBAR SPINE - COMPLETE 4+ VIEW  COMPARISON:  None.  FINDINGS: Transitional anatomy with partial sacralization of the L5 vertebral body. No evidence of acute fracture or malalignment. Vertebral body heights are maintained. Mild disc height loss at L5-S1. The T12 ribs are hypoplastic.  IMPRESSION: No acute fracture or malalignment.  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   Electronically Signed   By: Malachy MoanHeath  McCullough M.D.   On: 07/03/2013 15:51    EKG Interpretation   None       MDM   1. Muscle spasm   2. Costochondritis, acute    Pt comes in with some back pain, upper back - may be pleuritic, definitely worse with palpation. Pt had a dimer ordered, and other cardiac workup last time - all neg. She has been advised to see her pcp for further eval - as i think this is either m-s pain, or other non emergent etiology. She has no dysphagia - and so dont think it's esophageal. No concerns for dissection - as she has no risk factos, and neurovascular exam is normal.    Derwood KaplanAnkit Aleathia Purdy, MD 07/03/13 1631   Date: 07/03/2013   Rate: 101  Rhythm: normal sinus rhythm  QRS Axis: normal  Intervals: normal  ST/T Wave abnormalities: normal  Conduction Disutrbances: none  Narrative Interpretation: unremarkable      Derwood KaplanAnkit Eleyna Brugh, MD 07/03/13 [phone]

## 2013-07-03 NOTE — ED Notes (Signed)
Family at bedside. 

## 2013-07-03 NOTE — Discharge Instructions (Signed)
We saw you in the ER for the chest pain, back pain. All the results in the ER are normal, labs and imaging. We are not sure what is causing your symptoms - but it appears to be due to muscular pain. The workup in the ER is not complete, and is limited to screening for life threatening and emergent conditions only, so please see a primary care doctor for further evaluation.   Dolor de la pared torcica (Chest Wall Pain) Dolor en la pared torcica es dolor en o alrededor de los huesos y msculos de su pecho. Podrn pasar hasta 6 semanas hasta que comience a mejorar. Puede demorar ms tiempo si es fsicamente activo en su [place].  CAUSAS  El dolor en el pecho puede aparecer sin motivo. No obstante, algunas causas pueden ser:   Neomia Dear enfermedad viral como la gripe.  Traumatismos.  Tos.  La prctica de ejercicios.  Artritis.  Fibromialgia  [place]. INSTRUCCIONES PARA EL CUIDADO DOMICILIARIO  Evite hacer actividad fsica extenuante. Trate de no esforzarse o Electrical engineer. Aqu se incluyen las actividades en las que [place] los msculos del trax, los abdominales y los msculos laterales, especialmente si debe levantar objetos pesados.  Aplique hielo sobre la zona dolorida.  Ponga el hielo en una bolsa plstica.  Colquese una toalla entre la piel y la bolsa de hielo.  Deje la bolsa de hielo durante 15 a 20 minutos por hora, durante los primeros 2 [address]  [address].  Utilice los medicamentos de venta libre o de prescripcin para Chief Technology Officer, Environmental health practitioner o la [place], segn se lo indique el profesional que lo asiste. SOLICITE ATENCIN MDICA DE INMEDIATO SI:  El dolor aumenta o siente muchas molestias.  Tiene fiebre.  El dolor de [place].  Desarrolla nuevos e inexplicables sntomas.  Tiene nuseas o vmitos.  Berenice Primas o se siente mareado.  Tiene tos con flema (esputo), o tose con sangre. EST SEGURO QUE:   Comprende las instrucciones para el  alta mdica.  Controlar su enfermedad.  Solicitar atencin mdica de inmediato segn las indicaciones. Document Released: 07/25/2006 Document Revised: 09/05/2011 [facility] Patient Information 2014 [place], [place].  Costocondritis (Costochondritis) El profesional que lo asiste le ha diagnosticado el sndrome de la unin costocondral. La costocondritis (sndrome de Orthoptist) es la hinchazn e irritacin (inflamacin) del tejido (cartlago) que conecta las costillas con el esternn. Puede ocurrir espontneamente (sin otra causa), por un traumatismo (lesin causada por un accidente) o simplemente al toser, o luego de un ejercicio de poca intensidad. Podr tardar Elaina Hoops 6 semanas en curarse, y ms si no puede restringir sus actividades.  INSTRUCCIONES PARA EL CUIDADO DOMICILIARIO  Evite la actividad fsica extenuante. Trate de no esforzar las [employer] actividades habituales. Aqu se incluyen las [address] en las que [place] los msculos del pecho, abdominales (el vientre) y los Financial planner, especialmente si debe Artist.  Aplique hielo durante 15 a 20 minutos por hora mientras se encuentre despierto, durante los 2 primeros das. Coloque el hielo en una bolsa plstica y ponga una toalla entre la bolsa y la piel.  Utilice los medicamentos de venta libre o de prescripcin para Chief Technology Officer, Environmental health practitioner o la [place], segn se lo indique el profesional que lo asiste. SOLICITE ATENCIN MDICA DE INMEDIATO SI:  El dolor aumenta o siente muchas molestias.  Tiene fiebre.  Presenta dificultades respiratorias.  Tose y escupe sangre.  Siente falta de aire o dolor en el pecho, sudoracin o vmitos.  Desarrolla nuevos e inexplicables sntomas (problemas). EST SEGURO QUE:   Comprende las instrucciones para el alta mdica.  Controlar su enfermedad.  Solicitar atencin mdica de inmediato segn las indicaciones. Document Released: 03/23/2005 Document Revised: 09/05/2011 [facility]  Patient Information 2014 [place], [place].  Terapia con calor  (Heat Therapy)  La terapia con calor puede ayudar a aliviar los msculos y las articulaciones doloridos, tensos y rgidos. El calor no debe aplicarse en lesiones nuevas. Espere por lo menos a que pasen 48 horas despus de una lesin antes de emplear la terapia con calor. El calor tampoco debe usarse para calmar las molestias o el dolor que se produce inmediatamente despus de realizar [place]. Si an Electronics engineer o rigidez 3 horas despus de Buyer, retail actividad, Transport planner. PRECAUCIONES  Una temperatura muy alta o una exposicin prolongada al calor puede causar quemaduras. Sea cauto con la terapia de calor para evitar quemar la piel. Si usted tiene [employer], nouse calor hasta que haya hablado con su mdico:   Mala circulacin.  Heridas que se estn curando o piel con cicatrices en la zona en la que va a tratar.  Diabetes, enfermedades cardacas o hipertensin arterial.  Entumecimiento de la zona a tratar.  Hinchazn inusual de la zona a tratar.  Infecciones activas.  Cogulos sanguneos.  Cncer.  Incapacidad para comunicar la respuesta al [employer] Se incluye a los nios pequeos y las personas con [address] INSTRUCCIONES PARA EL CUIDADO EN EL HOGAR  Compresas hmedas calientes   Remoje una toalla limpia en agua caliente y extraiga el agua extra. La temperatura del agua debe ser confortable para la la piel.  Ponga la toalla tibia y hmeda en una bolsa de plstico.  Coloque una toalla delgada y seca entre la piel y la bolsa.  Aplique la bolsa con calor en la zona durante 5 minutos y controle su piel. La piel puede estar de color rosa pero no debe estar roja.  Deje la almohadilla trmica en el rea durante 20 a 30 minutos.  Repita esto cada 2 a 4 horas mientras est despierto. No utilice calor mientras usted est durmiendo. Bao de agua caliente   Llene una baera con  agua tibia. La temperatura del agua debe ser confortable para la la piel.  Coloque la parte afectada del cuerpo en la baera.  Remoje la zona durante 20 a 40 minutos.  Repita siempre que lo necesite. Bolsa de agua caliente   [employer] la bolsa hasta la mitad con agua caliente.  Extraiga el exceso de [place]. Cierre la tapa de [place] que [place].  Colquese una toalla entre la piel y [place].  Coloque la bolsa sobre la zona durante 5 minutos y controle su piel. La piel puede estar de color rosa pero no debe estar roja.  Deje la bolsa en el rea durante 15 a 30 minutos.  Repita esto cada 2 a 4 horas mientras est despierto. Almohadilla trmica   Colquese una toalla entre la piel y la almohadilla trmica.  Ajuste la almohadilla en temperatura baja.  Colquela sobre la zona durante 10 minutos y controle su piel. La piel puede estar de color rosa pero no debe estar roja.  Deje la almohadilla trmica en el rea durante 20 a 40 minutos.  Repita esto cada 2 a 4 horas mientras est despierto.  No se acueste sobre la almohadilla trmica.  No se duerma mientras la est usando.  No debe usarla cuando se  encuentre cerca del agua. El contacto con el agua puede provocar un shock elctrico. Romona CurlsSOLICITE ATENCIN MDICA SI:   Rigoberto Noelbserva ampollas, enrojecimiento, hinchazn o adormecimiento.  Tiene algn problema nuevo.  Los sntomas empeoran.  Tiene preguntas o preocupaciones. Si tiene algn problema, deje de usar la terapia de calor hasta que consulte al mdico.  ASEGRESE DE QUE:   Comprende estas instrucciones.  Controlar su enfermedad.  Solicitar ayuda de inmediato si no mejora o si empeora. Document Released: 09/05/2011 [facility] Patient Information 2014 [place], [place].  Calambres y espasmos musculares  (Muscle Cramps and Spasms)  Se denominan calambres y espasmos musculares cuando los msculos se comprimen. Generalmente mejoran en unos minutos. Los calambres  musculares son dolorosos. Son ms fuertes y duran ms que los espasmos musculares. Los espasmos pueden o no ser dolorosos. Pueden durar algunos segundos o mucho ms. CUIDADOS EN EL HOGAR   Beba gran cantidad de lquido para mantener el pis (orina) de tono claro o amarillo plido.  [place], estire y relaje el msculo.  Aplique una toalla hmeda, una almohadilla trmica o moje con agua tibia los msculos acalambrados.  Coloque una bolsa de hielo sobre la zona si le duele o est inflamada.  Ponga el hielo en una bolsa plstica.  Colquese una toalla entre la piel y la bolsa de hielo.  Deje el hielo en el lugar durante 15 a 20 minutos, 3 a 4 veces por da.  Slo tome los medicamentos que le indique el mdico. SOLICITE AYUDA DE INMEDIATO SI:  Los calambres o espasmos empeoran, ocurren con ms frecuencia o no mejoran con Allied Waste Industriesel tiempo.  ASEGRESE DE QUE:   Comprende estas instrucciones.  Controlar su enfermedad.  Solicitar ayuda de inmediato si no mejora o si empeora. Document Released: 09/09/2008 Document Revised: 10/08/2012 [facility] Patient Information 2014 [place], [place].

## 2013-07-03 NOTE — ED Notes (Signed)
Family at bedside, patient is comfortable. 

## 2013-07-03 NOTE — ED Notes (Signed)
Patient is alert and orientedx4.  Patient was explained discharge instructions and they understood them with no questions.  The patient's boyfriend, Georgette DoverHector Abrajan is taking the patient home.

## 2013-07-03 NOTE — ED Notes (Signed)
The pt is alert  Ok at present.  Family at the bedside

## 2013-07-03 NOTE — ED Notes (Signed)
Pt is here with nausea, back pain, and sob since 1am.  Pt reports upper back and right mid back pain for last 4 months.  Pt denies chest pain or pain with deep inspiration.  Pt talking in full sentences.  Translator phones used to gain information

## 2014-02-26 ENCOUNTER — Emergency Department (HOSPITAL_COMMUNITY)
Admission: EM | Admit: 2014-02-26 | Discharge: 2014-02-27 | Disposition: A | Discharge status: Home / Self Care | Payer: Self-pay | Attending: Emergency Medicine | Admitting: Emergency Medicine

## 2014-02-26 ENCOUNTER — Encounter (HOSPITAL_COMMUNITY): Payer: Self-pay | Admitting: Emergency Medicine

## 2014-02-26 DIAGNOSIS — Z8709 Personal history of other diseases of the respiratory system: Secondary | ICD-10-CM | POA: Insufficient documentation

## 2014-02-26 DIAGNOSIS — Z3202 Encounter for pregnancy test, result negative: Secondary | ICD-10-CM | POA: Insufficient documentation

## 2014-02-26 DIAGNOSIS — I10 Essential (primary) hypertension: Secondary | ICD-10-CM | POA: Insufficient documentation

## 2014-02-26 DIAGNOSIS — Z87891 Personal history of nicotine dependence: Secondary | ICD-10-CM | POA: Insufficient documentation

## 2014-02-26 DIAGNOSIS — F411 Generalized anxiety disorder: Secondary | ICD-10-CM | POA: Insufficient documentation

## 2014-02-26 DIAGNOSIS — Z8669 Personal history of other diseases of the nervous system and sense organs: Secondary | ICD-10-CM | POA: Insufficient documentation

## 2014-02-26 DIAGNOSIS — F329 Major depressive disorder, single episode, unspecified: Secondary | ICD-10-CM | POA: Insufficient documentation

## 2014-02-26 DIAGNOSIS — Z79899 Other long term (current) drug therapy: Secondary | ICD-10-CM | POA: Insufficient documentation

## 2014-02-26 DIAGNOSIS — F3289 Other specified depressive episodes: Secondary | ICD-10-CM | POA: Insufficient documentation

## 2014-02-26 DIAGNOSIS — R1012 Left upper quadrant pain: Secondary | ICD-10-CM | POA: Insufficient documentation

## 2014-02-26 LAB — COMPREHENSIVE METABOLIC PANEL
ALT: 11 U/L (ref 0–35)
AST: 14 U/L (ref 0–37)
Albumin: 3.8 g/dL (ref 3.5–5.2)
Alkaline Phosphatase: 75 U/L (ref 39–117)
Anion gap: 10 (ref 5–15)
BUN: 15 mg/dL (ref 6–23)
CO2: 24 mEq/L (ref 19–32)
Calcium: 9 mg/dL (ref 8.4–10.5)
Chloride: 103 mEq/L (ref 96–112)
Creatinine, Ser: 1.01 mg/dL (ref 0.50–1.10)
GFR calc Af Amer: 81 mL/min — ABNORMAL LOW (ref >90)
GFR calc non Af Amer: 70 mL/min — ABNORMAL LOW (ref >90)
Glucose, Bld: 122 mg/dL — ABNORMAL HIGH (ref 70–99)
Potassium: 4 mEq/L (ref 3.7–5.3)
Sodium: 137 mEq/L (ref 137–147)
Total Bilirubin: 0.2 mg/dL — ABNORMAL LOW (ref 0.3–1.2)
Total Protein: 7.2 g/dL (ref 6.0–8.3)

## 2014-02-26 LAB — CBC WITH DIFFERENTIAL/PLATELET
Basophils Absolute: 0 10*3/uL (ref 0.0–0.1)
Basophils Relative: 0 % (ref 0–1)
Eosinophils Absolute: 0.2 10*3/uL (ref 0.0–0.7)
Eosinophils Relative: 3 % (ref 0–5)
HCT: 38.7 % (ref 36.0–46.0)
Hemoglobin: 13.3 g/dL (ref 12.0–15.0)
Lymphocytes Relative: 46 % (ref 12–46)
Lymphs Abs: 3 10*3/uL (ref 0.7–4.0)
MCH: 31.3 pg (ref 26.0–34.0)
MCHC: 34.4 g/dL (ref 30.0–36.0)
MCV: 91.1 fL (ref 78.0–100.0)
Monocytes Absolute: 0.4 10*3/uL (ref 0.1–1.0)
Monocytes Relative: 6 % (ref 3–12)
Neutro Abs: 3 10*3/uL (ref 1.7–7.7)
Neutrophils Relative %: 45 % (ref 43–77)
Platelets: 196 10*3/uL (ref 150–400)
RBC: 4.25 MIL/uL (ref 3.87–5.11)
RDW: 13.1 % (ref 11.5–15.5)
WBC: 6.6 10*3/uL (ref 4.0–10.5)

## 2014-02-26 LAB — URINALYSIS, ROUTINE W REFLEX MICROSCOPIC
Bilirubin Urine: NEGATIVE
Glucose, UA: NEGATIVE mg/dL
Hgb urine dipstick: NEGATIVE
Ketones, ur: NEGATIVE mg/dL
Leukocytes, UA: NEGATIVE
Nitrite: NEGATIVE
Protein, ur: NEGATIVE mg/dL
Specific Gravity, Urine: 1.005 (ref 1.005–1.030)
Urobilinogen, UA: 0.2 mg/dL (ref 0.0–1.0)
pH: 6.5 (ref 5.0–8.0)

## 2014-02-26 LAB — PREGNANCY, URINE: Preg Test, Ur: NEGATIVE

## 2014-02-26 LAB — LIPASE, BLOOD: Lipase: 31 U/L (ref 11–59)

## 2014-02-26 NOTE — ED Notes (Signed)
Patient reports that she is having pain to the left upper quad of her abdomen.  States she has been seen at [facility] for the same and was told that she has a few small stones in her gallbladder but nothing serious.  States the pain is in the LUQ and goes up into her shoulder and back.  Denies chest pain or SOB.

## 2014-02-26 NOTE — ED Notes (Signed)
The pt is c/o lt upper abd pain for 2 days with some lt posterior shoulder pain no sob.  Pt c/o n v lmp aug 7th

## 2014-02-27 ENCOUNTER — Emergency Department (HOSPITAL_COMMUNITY): Payer: Self-pay

## 2014-02-27 ENCOUNTER — Encounter (HOSPITAL_COMMUNITY): Payer: Self-pay

## 2014-02-27 MED ORDER — SODIUM CHLORIDE 0.9 % IV BOLUS (SEPSIS)
1000.0000 mL | Freq: Once | INTRAVENOUS | Status: AC
  Administered 2014-02-27: 1000 mL via INTRAVENOUS

## 2014-02-27 MED ORDER — IOHEXOL 300 MG/ML  SOLN
100.0000 mL | Freq: Once | INTRAMUSCULAR | Status: AC | PRN
  Administered 2014-02-27: 100 mL via INTRAVENOUS

## 2014-02-27 MED ORDER — KETOROLAC TROMETHAMINE 15 MG/ML IJ SOLN
15.0000 mg | Freq: Once | INTRAMUSCULAR | Status: AC
  Administered 2014-02-27: 15 mg via INTRAVENOUS
  Filled 2014-02-27: qty 1

## 2014-02-27 MED ORDER — TRAMADOL HCL 50 MG PO TABS: 50.0000 mg | ORAL_TABLET | Freq: Four times a day (QID) | ORAL | Status: DC | PRN

## 2014-02-27 MED ORDER — IOHEXOL 300 MG/ML  SOLN
25.0000 mL | Freq: Once | INTRAMUSCULAR | Status: AC | PRN
  Administered 2014-02-27: 25 mL via ORAL

## 2014-02-27 MED ORDER — ONDANSETRON HCL 4 MG PO TABS: 4.0000 mg | ORAL_TABLET | Freq: Four times a day (QID) | ORAL | Status: DC

## 2014-02-27 MED ORDER — ONDANSETRON HCL 4 MG/2ML IJ SOLN
4.0000 mg | Freq: Once | INTRAMUSCULAR | Status: AC
  Administered 2014-02-27: 4 mg via INTRAVENOUS
  Filled 2014-02-27: qty 2

## 2014-02-27 MED ORDER — MORPHINE SULFATE 4 MG/ML IJ SOLN
4.0000 mg | Freq: Once | INTRAMUSCULAR | Status: AC
  Administered 2014-02-27: 2 mg via INTRAVENOUS
  Filled 2014-02-27: qty 1

## 2014-02-27 NOTE — Discharge Instructions (Signed)
Dolor abdominal °(Abdominal Pain) °El dolor puede tener muchas causas. Normalmente la causa del dolor abdominal no es una enfermedad y mejorará sin tratamiento. Frecuentemente puede controlarse y tratarse en casa. Su médico le realizará un examen físico y posiblemente solicite análisis de sangre y radiografías para ayudar a determinar la gravedad de su dolor. Sin embargo, en muchos casos, debe transcurrir más tiempo antes de que se pueda encontrar una causa evidente del dolor. Antes de llegar a ese punto, es posible que su médico no sepa si necesita más pruebas o un tratamiento más profundo. °INSTRUCCIONES PARA EL CUIDADO EN EL HOGAR  °Esté atento al dolor para ver si hay cambios. Las siguientes indicaciones ayudarán a aliviar cualquier molestia que pueda sentir: °· Tome solo medicamentos de venta libre o recetados, según las indicaciones del médico. °· No tome laxantes a menos que se lo haya indicado su médico. °· Pruebe con una dieta líquida absoluta (caldo, té o agua) según se lo indique su médico. Introduzca gradualmente una dieta normal, según su tolerancia. °SOLICITE ATENCIÓN MÉDICA SI: °· Tiene dolor abdominal sin explicación. °· Tiene dolor abdominal relacionado con náuseas o diarrea. °· Tiene dolor cuando orina o defeca. °· Experimenta dolor abdominal que lo despierta de noche. °· Tiene dolor abdominal que empeora o mejora cuando come alimentos. °· Tiene dolor abdominal que empeora cuando come alimentos grasosos. °· Tiene fiebre. °SOLICITE ATENCIÓN MÉDICA DE INMEDIATO SI:  °· El dolor no desaparece en un plazo máximo de 2 horas. °· No deja de (vomitar). °· El dolor se siente solo en partes del abdomen, como el lado derecho o la parte inferior izquierda del abdomen. °· Evacúa materia fecal sanguinolenta o negra, de aspecto alquitranado. °ASEGÚRESE DE QUE: °· Comprende estas instrucciones. °· Controlará su afección. °· Recibirá ayuda de inmediato si no mejora o si empeora. °Document Released: 06/13/2005  Document Revised: 06/18/2013 °ExitCare® Patient Information ©2015 Exit[employer]. This information is not intended to replace advice given to you by your health care provider. Make sure you discuss any questions you have with your health care provider. ° °

## 2014-03-06 NOTE — ED Provider Notes (Signed)
CSN: [id number]     Arrival date & time 02/26/14  2223 History   First MD Initiated Contact with Patient 02/27/14 [phone]     Chief Complaint  Patient presents with  . Abdominal Pain     (Consider location/radiation/quality/duration/timing/severity/associated sxs/prior Treatment) HPI  38 year old female with abdominal pain. Left upper quadrant. Patient has had the pain intermittently for about a year. Worse with the past 2 days. Describes the pain as a deep heat. No appreciable exacerbating relieving factors. The pain will radiate into her back and her left shoulder. Sometimes associated with nausea, but not all the time. No fevers or chills. No urinary complaints. No unusual vaginal bleeding or discharge. She reports that she's been told she has gallstones on previous evaluations.  Past Medical History  Diagnosis Date  . Depression   . Hypertension   . Anxiety   . Bronchitis   . Migraine headache    Past Surgical History  Procedure Laterality Date  . Cesarean section     Family History  Problem Relation Age of Onset  . Other Neg Hx   . Hypertension Mother   . Cancer Maternal Grandmother     stomach  . Diabetes Maternal Grandfather    History  Substance Use Topics  . Smoking status: Former Smoker    Types: Cigarettes, Cigars    Quit date: 04/21/2012  . Smokeless tobacco: Never Used  . Alcohol Use: No   OB History   Grav Para Term Preterm Abortions TAB SAB Ect Mult Living   Review of Systems  All systems reviewed and negative, other than as noted in HPI.   Allergies  Review of patient's allergies indicates no known allergies.  Home Medications   Prior to Admission medications   Medication Sig Start Date End Date Taking? Authorizing Provider  Calcium Carbonate-Vitamin D (CALCIUM 500 + D PO) Take 1 tablet by mouth daily.   Yes Historical Provider, MD  ibuprofen (ADVIL,MOTRIN) 800 MG tablet Take 800 mg by mouth every 8 (eight) hours as needed for  moderate pain.   Yes Historical Provider, MD  Multiple Vitamins-Minerals (MULTIVITAMIN WITH MINERALS) tablet Take 1 tablet by mouth daily.   Yes Historical Provider, MD  ondansetron (ZOFRAN) 4 MG tablet Take 1 tablet (4 mg total) by mouth every 6 (six) hours. 02/27/14   Raeford Razor, MD  traMADol (ULTRAM) 50 MG tablet Take 1 tablet (50 mg total) by mouth every 6 (six) hours as needed. 02/27/14   Raeford Razor, MD   BP 115/82  Pulse 97  Temp(Src) 98.7 F (37.1 C) (Oral)  Resp 14  Ht  (1.676 m)  Wt 136 lb (61.689 kg)  BMI 21.96 kg/m2  SpO2 100%  LMP 01/31/2014 Physical Exam  Nursing note and vitals reviewed. Constitutional: She appears well-developed and well-nourished. No distress.  HENT:  Head: Normocephalic and atraumatic.  Eyes: Conjunctivae are normal. Right eye exhibits no discharge. Left eye exhibits no discharge.  Neck: Neck supple.  Cardiovascular: Normal rate, regular rhythm and normal heart sounds.  Exam reveals no gallop and no friction rub.   No murmur heard. Pulmonary/Chest: Effort normal and breath sounds normal. No respiratory distress.  Abdominal: Soft. She exhibits no distension. There is tenderness. There is no rebound and no guarding.  Mild tenderness in the left upper quadrant. No rebound or guarding. No distention.  Genitourinary:  No CVA tenderness  Musculoskeletal: She exhibits no edema and  no tenderness.  Neurological: She is alert.  Skin: Skin is warm and dry.  Psychiatric: She has a normal mood and affect. Her behavior is normal. Thought content normal.    ED Course  Procedures (including critical care time) Labs Review Labs Reviewed  COMPREHENSIVE METABOLIC PANEL - Abnormal; Notable for the following:    Glucose, Bld 122 (*)    Total Bilirubin 0.2 (*)    GFR calc non Af Amer 70 (*)    GFR calc Af Amer 81 (*)    All other components within normal limits  URINALYSIS, ROUTINE W REFLEX MICROSCOPIC - Abnormal; Notable for the following:    Color,  Urine STRAW (*)    All other components within normal limits  CBC WITH DIFFERENTIAL  LIPASE, BLOOD  PREGNANCY, URINE    Imaging Review No results found.   EKG Interpretation None      MDM   Final diagnoses:  LUQ abdominal pain    38 year old female with left upper quadrant abdominal pain. This has been ongoing for over a year. She does have cholelithiasis. Her pain and tenderness are in the left upper quadrant though. She reports multiple previous evaluations and no clear etiology. I do not have an exact explanation at this time either, but have a low suspicion for emergent process. History stable for discharge at this time. Will treat symptomatically. Return cautions were discussed.    Raeford Razor, MD 03/06/14 [phone]

## 2014-03-26 ENCOUNTER — Inpatient Hospital Stay (HOSPITAL_COMMUNITY)
Admission: AD | Admit: 2014-03-26 | Discharge: 2014-03-26 | Disposition: A | Discharge status: Home / Self Care | Payer: Self-pay | Source: Ambulatory Visit | Attending: Obstetrics & Gynecology | Admitting: Obstetrics & Gynecology

## 2014-03-26 ENCOUNTER — Encounter (HOSPITAL_COMMUNITY): Payer: Self-pay

## 2014-03-26 DIAGNOSIS — R2232 Localized swelling, mass and lump, left upper limb: Secondary | ICD-10-CM

## 2014-03-26 DIAGNOSIS — R229 Localized swelling, mass and lump, unspecified: Secondary | ICD-10-CM | POA: Insufficient documentation

## 2014-03-26 HISTORY — DX: Adverse effect of unspecified anesthetic, initial encounter: T41.45XA

## 2014-03-26 HISTORY — DX: Other complications of anesthesia, initial encounter: T88.59XA

## 2014-03-26 LAB — POCT PREGNANCY, URINE: Preg Test, Ur: NEGATIVE

## 2014-03-26 NOTE — MAU Note (Signed)
[facility] [phone]#[id number], pt states here for boil under her left arm. Feels lump and it hurts. Area is not red or inflamed. LMP-03/03/2014. Denies bleeding or vag d/c issues. Pt has noted pain for one week. Only feels bump when taking shower.

## 2014-03-26 NOTE — MAU Provider Note (Signed)
History     CSN: [id number]  Arrival date and time: 03/26/14 [id number]   First Provider Initiated Contact with Patient 03/26/14 1159      No chief complaint on file.  HPI  [name] is a 38 y.o. female [phone] who presents with a painful lump on her left side. Two years ago she felt something similar on the front of her breast; she was told that it was inflammation. She had a mammogram 2 years ago when she had a bump on her front breast and has not had follow up since. She feels this bump only when she is in the shower,  when she has soap on her hands. The last time she felt it was this morning; the lump has been there for 2 days. The lump is painful, and is the size of "small."  Spanish interpretor at the bedside.    OB History   Grav Para Term Preterm Abortions TAB SAB Ect Mult Living   2 2 2       2       Past Medical History  Diagnosis Date  . Depression   . Hypertension   . Anxiety   . Bronchitis   . Migraine headache   . Complication of anesthesia     BP dropped, shaking, needed multiple blankets    Past Surgical History  Procedure Laterality Date  . Cesarean section      Family History  Problem Relation Age of Onset  . Other Neg Hx   . Hypertension Mother   . Cancer Maternal Grandmother     stomach  . Diabetes Maternal Grandfather     History  Substance Use Topics  . Smoking status: Former Smoker    Types: Cigarettes, Cigars    Quit date: 04/21/2012  . Smokeless tobacco: Never Used  . Alcohol Use: No    Allergies:  Allergies  Allergen Reactions  . Morphine And Related Shortness Of Breath    Prescriptions prior to admission  Medication Sig Dispense Refill  . ibuprofen (ADVIL,MOTRIN) 800 MG tablet Take 800 mg by mouth every 8 (eight) hours as needed for moderate pain.      Marland Kitchen. OVER THE COUNTER MEDICATION Apply 1 application topically 2 (two) times daily as needed (pain). Pomada DRAGON cream       Results for orders placed  during the hospital encounter of 03/26/14 (from the past 72 hour(s))  POCT PREGNANCY, URINE     Status: None   Collection Time    03/26/14 10:20 AM      Result Value Ref Range   Preg Test, Ur NEGATIVE  NEGATIVE   Comment:            THE SENSITIVITY OF THIS     METHODOLOGY IS >24 mIU/mL    Review of Systems  Constitutional: Negative for fever and chills.  Skin:       Painful lump near armpit    Physical Exam   Blood pressure 125/83, pulse 81, temperature 97.5 F (36.4 C), temperature source Oral, resp. rate 16, height 5' 1.25" (1.556 m), weight 62.256 kg (137 lb 4 oz), last menstrual period 03/03/2014.  Physical Exam  Constitutional: She is oriented to person, place, and time. She appears well-developed and well-nourished. No distress.  HENT:  Head: Normocephalic.  Eyes: Pupils are equal, round, and reactive to light.  Neck: Neck supple.  Respiratory: Effort normal. She exhibits tenderness. She exhibits no swelling. Right breast exhibits no inverted nipple, no  nipple discharge and no skin change. Left breast exhibits no inverted nipple, no nipple discharge and no skin change. Breasts are symmetrical.    Musculoskeletal: Normal range of motion.  Neurological: She is alert and oriented to person, place, and time.  Skin: Skin is warm. She is not diaphoretic.  Psychiatric: Her behavior is normal.    MAU Course  Procedures None  MDM Spoke to [place] with the [facility] mammogram program She will call the patient to schedule using spanish interpretor. Patient made aware.   Assessment and Plan   A: 1. Axillary lump, left    P: Discharge home in stable condition [facility] referral for mammogram/ diagnostic US Patient instructed to call the clinic for follow up   Debbrah Alar, NP 03/28/2014 10:17 PM

## 2014-03-28 ENCOUNTER — Other Ambulatory Visit (HOSPITAL_COMMUNITY): Payer: Self-pay | Admitting: Obstetrics and Gynecology

## 2014-03-28 DIAGNOSIS — R2232 Localized swelling, mass and lump, left upper limb: Secondary | ICD-10-CM

## 2014-04-02 ENCOUNTER — Other Ambulatory Visit: Payer: Self-pay | Admitting: Obstetrics and Gynecology

## 2014-04-02 ENCOUNTER — Other Ambulatory Visit: Payer: Self-pay

## 2014-04-02 DIAGNOSIS — R2232 Localized swelling, mass and lump, left upper limb: Secondary | ICD-10-CM

## 2014-04-02 NOTE — MAU Provider Note (Signed)
Attestation of Attending Supervision of Advanced Practitioner (PA/CNM/NP): Evaluation and management procedures were performed by the Advanced Practitioner under my supervision and collaboration.  I have reviewed the Advanced Practitioner's note and chart, and I agree with the management and plan.  Shalom Ware, MD, FACOG Attending Obstetrician & Gynecologist Faculty Practice, [facility]   

## 2014-04-21 ENCOUNTER — Other Ambulatory Visit (HOSPITAL_COMMUNITY): Payer: Self-pay | Admitting: *Deleted

## 2014-04-21 DIAGNOSIS — N6320 Unspecified lump in the left breast, unspecified quadrant: Secondary | ICD-10-CM

## 2014-04-28 ENCOUNTER — Encounter (HOSPITAL_COMMUNITY): Payer: Self-pay

## 2014-05-01 ENCOUNTER — Ambulatory Visit
Admission: RE | Admit: 2014-05-01 | Discharge: 2014-05-01 | Disposition: A | Discharge status: Home / Self Care | Payer: No Typology Code available for payment source | Source: Ambulatory Visit | Attending: Obstetrics and Gynecology | Admitting: Obstetrics and Gynecology

## 2014-05-01 ENCOUNTER — Encounter (HOSPITAL_COMMUNITY): Payer: Self-pay

## 2014-05-01 ENCOUNTER — Ambulatory Visit (HOSPITAL_COMMUNITY)
Admission: RE | Admit: 2014-05-01 | Discharge: 2014-05-01 | Disposition: A | Discharge status: Home / Self Care | Payer: No Typology Code available for payment source | Source: Ambulatory Visit | Attending: Obstetrics and Gynecology | Admitting: Obstetrics and Gynecology

## 2014-05-01 VITALS — BP 100/60 | Ht 63.0 in | Wt 141.4 lb

## 2014-05-01 DIAGNOSIS — N6320 Unspecified lump in the left breast, unspecified quadrant: Secondary | ICD-10-CM

## 2014-05-01 DIAGNOSIS — Z1239 Encounter for other screening for malignant neoplasm of breast: Secondary | ICD-10-CM

## 2014-05-01 DIAGNOSIS — N644 Mastodynia: Secondary | ICD-10-CM

## 2014-05-01 NOTE — Progress Notes (Signed)
Complaints of left breast lump x 1.5 months that is painful. Patient states the pain comes and goes. Patient rated pain at a 7-10 out of 10.   Pap Smear:  Pap smear not completed today. Last Pap smear was in April 2012 per patient. Unable to complete Pap smear today due to patient on her menstrual cycle. Patient has scheduled a Pap smear in [facility] clinic for 06/18/2014. Per patient has no history of an abnormal Pap smear. No Pap smear results in EPIC.  Physical exam: Breasts Breasts symmetrical. No skin abnormalities bilateral breasts. No nipple retraction bilateral breasts. No nipple discharge bilateral breasts. No lymphadenopathy. No lumps palpated bilateral breasts. Complaints of left outer breast tenderness on exam. [facility] Appointment scheduled for Thursday, May 01, 2014 at 0945.     Pelvic/Bimanual No Pap smear completed today since patient is currently on her menstrual cycle. Pap smear not indicated per [facility] guidelines.

## 2014-05-01 NOTE — Patient Instructions (Signed)
Explained to  Kathy Berry that she needs a Pap smear today due to last Pap smear was in April 2012 per patient. Patient is currently on menstrual cycle therefore [facility] Let her know [facility] will cover Pap smears every 3 years unless has a history of abnormal Pap smears. [facility]. Appointment scheduled for Thursday, May 01, 2014 at 0945. Patient aware of appointment and will be there.  Kathy Berry verbalized understanding.  Abdulrahim Siddiqi, Kathaleen Maserhristine Poll, RN 10:39 AM

## 2014-06-18 ENCOUNTER — Encounter (HOSPITAL_COMMUNITY): Payer: Self-pay

## 2014-06-18 ENCOUNTER — Ambulatory Visit (HOSPITAL_COMMUNITY)
Admission: RE | Admit: 2014-06-18 | Discharge: 2014-06-18 | Disposition: A | Discharge status: Home / Self Care | Payer: Self-pay | Source: Ambulatory Visit | Attending: Obstetrics and Gynecology | Admitting: Obstetrics and Gynecology

## 2014-06-18 VITALS — BP 108/64 | Temp 98.0°F | Ht 62.0 in | Wt 141.0 lb

## 2014-06-18 DIAGNOSIS — Z01419 Encounter for gynecological examination (general) (routine) without abnormal findings: Secondary | ICD-10-CM

## 2014-06-18 NOTE — Patient Instructions (Signed)
Explained to Kathy Berry that [facility] will cover Pap smears and co-testing every 5 years unless has a history of abnormal Pap smears. Let patient know will follow up with her within the next couple weeks with results of Pap smear by phone. Discussed with patient the importance of eating a high fiber diet to help reduce constipation. Kathy Berry verbalized understanding.

## 2014-06-18 NOTE — Progress Notes (Signed)
No complaints today.  Pap Smear:  Pap smear completed today. Last Pap smear was in April 2012 and normal per patient. Per patient has no history of an abnormal Pap smear. No Pap smear results in EPIC.    Pelvic/Bimanual   Ext Genitalia No lesions, no swelling and no discharge observed on external genitalia.         Vagina Vagina pink and normal texture. No lesions or discharge observed in vagina.          Cervix Cervix is present. Cervix pink and of normal texture. Cervix friable. No discharge observed.     Uterus Uterus is present and palpable. Uterus in normal position and normal size.        Adnexae Bilateral ovaries present and palpable. No tenderness on palpation.          Rectovaginal Irration observed on rectal area due to straining from constipation. Discussed eating a high fiber diet with patient.

## 2014-06-19 LAB — CYTOLOGY - PAP

## 2014-06-24 ENCOUNTER — Telehealth (HOSPITAL_COMMUNITY): Payer: Self-pay | Admitting: *Deleted

## 2014-06-24 NOTE — Telephone Encounter (Signed)
Telephoned patient at home # and left message to return call to [facility]. Used interpreter Julie Sowell. 

## 2014-06-30 ENCOUNTER — Telehealth (HOSPITAL_COMMUNITY): Payer: Self-pay | Admitting: *Deleted

## 2014-06-30 NOTE — Telephone Encounter (Signed)
Telephoned patient at home # and left message to return call to [facility]. Used interpreter Julie Sowell. 

## 2014-07-01 ENCOUNTER — Telehealth (HOSPITAL_COMMUNITY): Payer: Self-pay | Admitting: *Deleted

## 2014-07-01 NOTE — Telephone Encounter (Signed)
Telephoned patient at home # and discussed negative pap smear results. HPV results negative. Next pap smear due in 5 years. Used interpreter Delorise RoyalsJulie Sowell.

## 2015-04-03 ENCOUNTER — Ambulatory Visit: Payer: Self-pay | Admitting: Internal Medicine

## 2015-05-07 ENCOUNTER — Emergency Department (HOSPITAL_COMMUNITY)
Admission: EM | Admit: 2015-05-07 | Discharge: 2015-05-07 | Disposition: A | Discharge status: Home / Self Care | Payer: No Typology Code available for payment source | Attending: Emergency Medicine | Admitting: Emergency Medicine

## 2015-05-07 ENCOUNTER — Encounter (HOSPITAL_COMMUNITY): Payer: Self-pay | Admitting: Emergency Medicine

## 2015-05-07 ENCOUNTER — Emergency Department (HOSPITAL_COMMUNITY): Payer: No Typology Code available for payment source

## 2015-05-07 DIAGNOSIS — Y92410 Unspecified street and highway as the place of occurrence of the external cause: Secondary | ICD-10-CM | POA: Insufficient documentation

## 2015-05-07 DIAGNOSIS — I10 Essential (primary) hypertension: Secondary | ICD-10-CM | POA: Diagnosis not present

## 2015-05-07 DIAGNOSIS — Z8709 Personal history of other diseases of the respiratory system: Secondary | ICD-10-CM | POA: Diagnosis not present

## 2015-05-07 DIAGNOSIS — G43909 Migraine, unspecified, not intractable, without status migrainosus: Secondary | ICD-10-CM | POA: Insufficient documentation

## 2015-05-07 DIAGNOSIS — S4992XA Unspecified injury of left shoulder and upper arm, initial encounter: Secondary | ICD-10-CM | POA: Insufficient documentation

## 2015-05-07 DIAGNOSIS — Z79899 Other long term (current) drug therapy: Secondary | ICD-10-CM | POA: Diagnosis not present

## 2015-05-07 DIAGNOSIS — Y9389 Activity, other specified: Secondary | ICD-10-CM | POA: Insufficient documentation

## 2015-05-07 DIAGNOSIS — Y999 Unspecified external cause status: Secondary | ICD-10-CM | POA: Insufficient documentation

## 2015-05-07 DIAGNOSIS — Z8659 Personal history of other mental and behavioral disorders: Secondary | ICD-10-CM | POA: Insufficient documentation

## 2015-05-07 DIAGNOSIS — Z87891 Personal history of nicotine dependence: Secondary | ICD-10-CM | POA: Insufficient documentation

## 2015-05-07 LAB — I-STAT BETA HCG BLOOD, ED (MC, WL, AP ONLY): I-stat hCG, quantitative: <5 m[IU]/mL (ref <5)

## 2015-05-07 MED ORDER — DIAZEPAM 2 MG PO TABS
2.0000 mg | ORAL_TABLET | Freq: Once | ORAL | Status: AC
  Administered 2015-05-07: 2 mg via ORAL
  Filled 2015-05-07: qty 1

## 2015-05-07 MED ORDER — DIAZEPAM 2 MG PO TABS: 2.0000 mg | ORAL_TABLET | Freq: Three times a day (TID) | ORAL | Status: AC

## 2015-05-07 MED ORDER — KETOROLAC TROMETHAMINE 30 MG/ML IJ SOLN
30.0000 mg | Freq: Once | INTRAMUSCULAR | Status: AC
  Administered 2015-05-07: 30 mg via INTRAMUSCULAR
  Filled 2015-05-07: qty 1

## 2015-05-07 MED ORDER — IBUPROFEN 800 MG PO TABS: 800.0000 mg | ORAL_TABLET | Freq: Three times a day (TID) | ORAL | Status: AC
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
## 2015-05-07 NOTE — ED Notes (Signed)
Pt back from X-ray.  
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
## 2015-05-07 NOTE — ED Notes (Signed)
Pt returned from x-ray. Pt doesn't want ice pack.

## 2015-05-07 NOTE — ED Notes (Signed)
Dr. Lockwood at the bedside. 

## 2015-05-07 NOTE — ED Notes (Signed)
Patient is able to move left upper extremity when assisted. Sling provided per Dr. Jeraldine LootsLockwood.
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
## 2015-07-17 ENCOUNTER — Encounter: Payer: Self-pay | Admitting: Family Medicine

## 2015-07-22 ENCOUNTER — Encounter: Payer: Self-pay | Admitting: Family Medicine
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
## 2017-09-02 ENCOUNTER — Ambulatory Visit: Payer: Self-pay | Admitting: Family Medicine

## 2017-09-02 ENCOUNTER — Encounter: Payer: Self-pay | Admitting: Family Medicine

## 2017-09-02 ENCOUNTER — Ambulatory Visit (INDEPENDENT_AMBULATORY_CARE_PROVIDER_SITE_OTHER): Payer: Self-pay | Admitting: Family Medicine

## 2017-09-02 VITALS — BP 121/79 | HR 71 | Temp 98.5°F | Resp 16 | Ht 63.5 in | Wt 153.8 lb

## 2017-09-02 DIAGNOSIS — M549 Dorsalgia, unspecified: Secondary | ICD-10-CM

## 2017-09-02 DIAGNOSIS — R5383 Other fatigue: Secondary | ICD-10-CM

## 2017-09-02 DIAGNOSIS — R42 Dizziness and giddiness: Secondary | ICD-10-CM

## 2017-09-02 DIAGNOSIS — N926 Irregular menstruation, unspecified: Secondary | ICD-10-CM

## 2017-09-02 LAB — POCT URINE PREGNANCY: Preg Test, Ur: NEGATIVE

## 2017-09-02 MED ORDER — CYCLOBENZAPRINE HCL 10 MG PO TABS: 10.0000 mg | ORAL_TABLET | Freq: Three times a day (TID) | ORAL | 1 refills | Status: AC | PRN

## 2017-09-02 MED ORDER — IBUPROFEN 600 MG PO TABS: 600.0000 mg | ORAL_TABLET | Freq: Three times a day (TID) | ORAL | 0 refills | Status: AC | PRN
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
## 2017-09-02 NOTE — Patient Instructions (Addendum)
1. papanicolao con el departamento de salud    IF you received an x-ray today, you will receive an invoice from [facility]. Please contact [facility] at [phone] with questions or concerns regarding your invoice.   IF you received labwork today, you will receive an invoice from [facility]. Please contact [facility] at [phone] with questions or concerns regarding your invoice.   Our billing staff will not be able to assist you with questions regarding bills from these companies.  You will be contacted with the lab results as soon as they are available. The fastest way to get your results is to activate your My Chart account. Instructions are located on the last page of this paperwork. If you have not heard from [place] regarding the results in 2 weeks, please contact this office.     Mid-Back Strain With Rehab Mid-Back Strain Rehab Consulte al mdico qu ejercicios son seguros para usted. Haga los ejercicios exactamente como se lo haya indicado el mdico y gradelos como se lo hayan indicado. Es normal sentir un leve estiramiento, tirn, rigidez o molestia cuando haga estos ejercicios, pero debe detenerse de inmediato si siento un dolor repentino o si el dolor empeora. No comience a hacer estos ejercicios hasta que se lo indique el mdico. EJERCICIOS DE [employer] Y AMPLITUD DE MOVIMIENTOS Este ejercicio calienta los msculos y las articulaciones, y mejora la movilidad y la flexibilidad de la espalda y los hombros. Adems, ayuda a Engineer, materials. Ejercicio A: Estiramiento de pecho y columna 1. Acustese boca arriba sobre una superficie firme. 2. Use una toalla o una manta pequea para armar un rollo de 4 pulgadas (10 cm) de dimetro. 3. Coloque la toalla a lo largo, debajo de la parte media de la espalda de modo que quede debajo de la columna, pero no debajo de los omplatos. 4. Para aumentar el estiramiento, puede colocar las manos debajo de la cabeza y dejar que los  codos caigan a los costados. 5. Mantenga esta posicin durante __________ segundos.  Repita el ejercicio __________ veces. Realice este ejercicio __________ veces al da. EJERCICIOS DE FORTALECIMIENTO Estos ejercicios fortalecen los msculos de la espalda y los omplatos, y les otorgan resistencia. La resistencia es la capacidad de usar los msculos durante un tiempo prolongado, incluso despus de que se cansen. Ejercicio B: Elevaciones alternadas de pierna y brazo 1. Apoye las palmas de las manos y las rodillas sobre una superficie firme. Si se colocar sobre una superficie muy dura, puede usar un elemento acolchado para apoyar las rodillas, como una alfombrilla para ejercicios. 2. Alinee los brazos y las piernas. Las manos deben estar debajo de los hombros y las rodillas debajo de la cadera. 3. Eleve la pierna izquierda hacia atrs. Al mismo tiempo, eleve el brazo derecho y Associate Professor frente a usted. ? No eleve la pierna por encima de la cadera. ? No eleve el brazo por encima del hombro. ? Mantenga los msculos del abdomen y de la espalda contrados. ? Mantenga la cadera [employer] el suelo. ? No arquee la espalda. ? Mantenga el equilibrio con cuidado y no contenga la respiracin. 4. Mantenga esta posicin durante __________ segundos. 5. Lentamente regrese a la posicin inicial y repita el ejercicio con la pierna derecha y el brazo izquierdo.  Repita __________ veces. Realice este ejercicio __________ veces al da. Ejercicio C: Remar con los brazos extendidos (extensin de hombros) 1. Prese con los pies separados a la distancia de los hombros. 2. Daylene Posey banda para ejercicios a  un objeto estable que est frente a usted, de modo que la banda est por encima de la altura del hombro o en el mismo nivel. 3. Sostenga un extremo de la banda para ejercicios en cada mano. 4. Extienda los codos y U.S. Bancorplevante las manos a la altura de los hombros. 5. Camine hacia atrs, alejndose del extremo fijo de  la banda para ejercicios, hasta que Wittmannesta se tense. 6. Junte los omplatos y baje las manos hacia los costados de los muslos. Detngase cuando las manos estn en la misma posicin en ambos costados. No deje que las manos vayan hacia atrs del cuerpo. 7. Mantenga esta posicin durante __________ segundos. 8. Vuelva lentamente a la posicin inicial.  Repita __________ veces. Realice este ejercicio __________ veces al da. Ejercicio D: Rotacin externa con el hombro, en decbito prono 1. Acustese boca abajo en una cama firme de modo que el antebrazo izquierdo/derecho cuelgue sobre el borde de la cama y la parte superior del brazo quede sobre la cama, extendido lejos del cuerpo. ? El codo debe estar flexionado. ? La palma debe CBS Corporationmirar hacia los pies. 2. Si se lo indican, sostenga una pesa de __________ con la mano. 3. Contraiga los omplatos hacia el centro de la espalda. No levante el hombro hacia la [place]oreja. 4. Mantenga el codo flexionado en forma de "L" (90 grados) mientras mueve el antebrazo hacia el techo lentamente. Mueva el antebrazo hasta la altura de la cama en direccin a la cabeza. ? No mueva la parte superior del brazo. ? En el final del movimiento, la palma debe apuntar hacia el suelo. 5. Mantenga esta posicin durante __________ segundos. 6. Vuelva lentamente a la posicin inicial y relaje los msculos.  Repita __________ veces. Realice este ejercicio __________ veces al da. Ejercicio E: Retraccin escapular y rotacin externa, remo 1. Sintese en una silla estable que no tenga apoyabrazos o pngase de pie. 2. Ate una banda para ejercicios a un objeto estable que est frente a usted, de modo que la banda est a la altura del hombro. 3. Sostenga un extremo de la banda para ejercicios en cada mano. 4. Lleve los brazos extendidos adelante. 5. Camine hacia atrs, alejndose del extremo fijo de la banda para ejercicios, Whole Foodshasta que esta se tense. 6. Tire la Calpine Corporationbanda hacia atrs. Mientras hace  esto, flexione los codos y Apple Computercontraiga los omplatos, pero evite mover el resto del cuerpo. No levante los hombros Time Warnerhacia las orejas. 7. Detngase cuando los codos estn a los lados o ligeramente detrs del cuerpo. 8. Mantenga esta posicin durante __________ segundos. 9. Extienda lentamente los brazos para regresar a la posicin inicial.  Repita __________ veces. Realice este ejercicio __________ veces al da. Tyson DensePOSTURA Y MECNICA CORPORAL La Water quality scientistmecnica corporal se refiere a los movimientos y a las posiciones del cuerpo mientras realiza las actividades diarias. La postura es una parte de la Water quality scientistmecnica corporal. La buena postura y la Administrator, sportsmecnica corporal saludable pueden ayudar a Acupuncturistaliviar el estrs en las articulaciones y los tejidos del cuerpo. La buena postura significa que la columna mantiene su posicin natural de curvatura en forma de S (la columna est en una posicin neutral), los hombros Zenaida Niecevan un poco hacia atrs y la cabeza no se inclina hacia adelante. A continuacin, se incluyen pautas generales para mejorar la postura y Regulatory affairs officerla mecnica corporal en las actividades diarias. De pie  Al estar de pie, mantenga la columna en la posicin neutral y los pies separados al ancho de caderas, aproximadamente. Mantenga las rodillas ligeramente  flexionadas. [place], los hombros y las caderas deben estar alineados.  Cuando realice una tarea en la que deba inclinarse hacia adelante y estar de pie en el mismo sitio durante mucho tiempo, coloque un pie en un objeto estable de 2 a 4 pulgadas (5 a 10 cm) de alto, como un taburete. Esto ayuda a que la columna mantenga una posicin neutral.  Sentado  Cuando est sentado, mantenga la columna en posicin neutral y deje los pies apoyados en el suelo. Use un apoyapis, si es necesario, y [employer]Ex muslos paralelos al suelo. Evite redondear los hombros e inclinar la cabeza hacia adelante.  Cuando trabaje en un escritorio o con una computadora, el escritorio debe estar a una  altura en la que las manos estn un poco ms abajo que los codos. Deslice la silla debajo del escritorio, de modo de estar lo suficientemente cerca como para mantener una buena [place].  Cuando trabaje con una computadora, coloque el monitor a una altura que le permita mirar derecho hacia adelante, sin tener que inclinar la cabeza hacia adelante o [place] atrs.  Reposo Al descansar o estar acostado, evite las posiciones que le causen ms dolor.  Si siente dolor al hacer actividades que exigen sentarse, inclinarse, agacharse o ponerse en cuclillas (actividades basadas en la flexin), acustese en una posicin en la que el cuerpo no deba doblarse mucho. Por ejemplo, evite acurrucarse de costado con los brazos y las rodillas cerca del pecho (posicin fetal).  Si siente dolor con las actividades que exigen estar de pie durante mucho tiempo o Furniture conservator/restorer los brazos (actividades basadas en la extensin), acustese con la columna en una posicin neutral y flexione ligeramente las rodillas. Pruebe con las siguientes posiciones: ? Teacher, music de costado con una almohada entre las rodillas. ? Acostarse boca arriba con una almohada debajo de las rodillas.  Levantar objetos  Cuando tenga que levantar un objeto, mantenga los pies separados el ancho de los hombros y apriete los msculos abdominales.  Flexione las rodillas y la cadera, y Dietitian la columna en posicin neutral. Es importante levantar utilizando la fuerza de las piernas, no de la espalda. No trabe las rodillas hacia afuera.  Siempre pida ayuda a otra persona para levantar objetos pesados o incmodos.  Esta informacin no tiene Theme park manager el consejo del mdico. Asegrese de hacerle al mdico cualquier pregunta que tenga. Document Released: 03/30/2006 Document Revised: 10/28/2014 Document Reviewed: 03/25/2015 Elsevier Interactive Patient Education  2018 [employer].

## 2017-09-03 LAB — COMPREHENSIVE METABOLIC PANEL
ALT: 17 IU/L (ref 0–32)
AST: 22 IU/L (ref 0–40)
Albumin/Globulin Ratio: 1.6 (ref 1.2–2.2)
Albumin: 4.2 g/dL (ref 3.5–5.5)
Alkaline Phosphatase: 69 IU/L (ref 39–117)
BUN/Creatinine Ratio: 19 (ref 9–23)
BUN: 12 mg/dL (ref 6–24)
Bilirubin Total: 0.3 mg/dL (ref 0.0–1.2)
CO2: 21 mmol/L (ref 20–29)
Calcium: 9 mg/dL (ref 8.7–10.2)
Chloride: 106 mmol/L (ref 96–106)
Creatinine, Ser: 0.62 mg/dL (ref 0.57–1.00)
GFR calc Af Amer: 130 mL/min/{1.73_m2} (ref >59)
GFR calc non Af Amer: 112 mL/min/{1.73_m2} (ref >59)
Globulin, Total: 2.6 g/dL (ref 1.5–4.5)
Glucose: 84 mg/dL (ref 65–99)
Potassium: 5.4 mmol/L — ABNORMAL HIGH (ref 3.5–5.2)
Sodium: 142 mmol/L (ref 134–144)
Total Protein: 6.8 g/dL (ref 6.0–8.5)

## 2017-09-03 LAB — CBC
Hematocrit: 41.6 % (ref 34.0–46.6)
Hemoglobin: 13.2 g/dL (ref 11.1–15.9)
MCH: 30.9 pg (ref 26.6–33.0)
MCHC: 31.7 g/dL (ref 31.5–35.7)
MCV: 97 fL (ref 79–97)
Platelets: 221 10*3/uL (ref 150–379)
RBC: 4.27 x10E6/uL (ref 3.77–5.28)
RDW: 13.6 % (ref 12.3–15.4)
WBC: 4.9 10*3/uL (ref 3.4–10.8)

## 2017-09-03 LAB — TSH: TSH: 2.48 u[IU]/mL (ref 0.450–4.500)

## 2017-09-06 ENCOUNTER — Encounter: Payer: Self-pay | Admitting: Family Medicine

## 2017-09-06 DIAGNOSIS — E875 Hyperkalemia: Secondary | ICD-10-CM

## 2017-11-27 ENCOUNTER — Encounter (HOSPITAL_COMMUNITY): Payer: Self-pay | Admitting: Emergency Medicine
